# Patient Record
Sex: Female | Born: 1995 | Race: White | Hispanic: No | Marital: Single | Smoking: Never smoker
Health system: Southern US, Community
[De-identification: ages and names within clinical notes are randomized; demographics above are authoritative.]

## PROBLEM LIST (undated history)

## (undated) DIAGNOSIS — J45909 Unspecified asthma, uncomplicated: Secondary | ICD-10-CM

## (undated) DIAGNOSIS — M419 Scoliosis, unspecified: Secondary | ICD-10-CM

## (undated) HISTORY — PX: WRIST SURGERY: SHX841

## (undated) HISTORY — PX: BACK SURGERY: SHX140

---

## 2016-03-27 DIAGNOSIS — Z793 Long term (current) use of hormonal contraceptives: Secondary | ICD-10-CM | POA: Diagnosis not present

## 2016-03-27 DIAGNOSIS — J45909 Unspecified asthma, uncomplicated: Secondary | ICD-10-CM | POA: Diagnosis not present

## 2016-03-27 DIAGNOSIS — J329 Chronic sinusitis, unspecified: Secondary | ICD-10-CM | POA: Diagnosis not present

## 2016-03-27 DIAGNOSIS — R05 Cough: Secondary | ICD-10-CM | POA: Diagnosis not present

## 2016-07-31 DIAGNOSIS — Z1389 Encounter for screening for other disorder: Secondary | ICD-10-CM | POA: Diagnosis not present

## 2016-07-31 DIAGNOSIS — J029 Acute pharyngitis, unspecified: Secondary | ICD-10-CM | POA: Diagnosis not present

## 2016-08-06 DIAGNOSIS — R05 Cough: Secondary | ICD-10-CM | POA: Diagnosis not present

## 2016-08-06 DIAGNOSIS — J029 Acute pharyngitis, unspecified: Secondary | ICD-10-CM | POA: Diagnosis not present

## 2016-08-25 DIAGNOSIS — N760 Acute vaginitis: Secondary | ICD-10-CM | POA: Diagnosis not present

## 2016-10-08 DIAGNOSIS — N921 Excessive and frequent menstruation with irregular cycle: Secondary | ICD-10-CM | POA: Diagnosis not present

## 2016-11-05 DIAGNOSIS — S61552A Open bite of left wrist, initial encounter: Secondary | ICD-10-CM | POA: Diagnosis not present

## 2016-11-05 DIAGNOSIS — W5503XA Scratched by cat, initial encounter: Secondary | ICD-10-CM | POA: Diagnosis not present

## 2016-11-05 DIAGNOSIS — M25532 Pain in left wrist: Secondary | ICD-10-CM | POA: Diagnosis not present

## 2016-11-05 DIAGNOSIS — W5501XA Bitten by cat, initial encounter: Secondary | ICD-10-CM | POA: Diagnosis not present

## 2016-12-26 DIAGNOSIS — R0989 Other specified symptoms and signs involving the circulatory and respiratory systems: Secondary | ICD-10-CM | POA: Diagnosis not present

## 2016-12-26 DIAGNOSIS — J069 Acute upper respiratory infection, unspecified: Secondary | ICD-10-CM | POA: Diagnosis not present

## 2016-12-26 DIAGNOSIS — Z87440 Personal history of urinary (tract) infections: Secondary | ICD-10-CM | POA: Diagnosis not present

## 2016-12-26 DIAGNOSIS — R05 Cough: Secondary | ICD-10-CM | POA: Diagnosis not present

## 2016-12-26 DIAGNOSIS — Z8701 Personal history of pneumonia (recurrent): Secondary | ICD-10-CM | POA: Diagnosis not present

## 2016-12-26 DIAGNOSIS — Z793 Long term (current) use of hormonal contraceptives: Secondary | ICD-10-CM | POA: Diagnosis not present

## 2017-01-20 DIAGNOSIS — N921 Excessive and frequent menstruation with irregular cycle: Secondary | ICD-10-CM | POA: Diagnosis not present

## 2017-04-23 ENCOUNTER — Telehealth: Payer: Self-pay

## 2017-04-23 NOTE — Telephone Encounter (Signed)
Pt calling for a different rx of bcp c higher dose as she is still having BTB. [phone]

## 2017-04-24 NOTE — Telephone Encounter (Signed)
Let her know we will get back to her on Monday about this, as I need to review or old chart first.  When is she due to start a new pack of pills? Thx.

## 2017-04-24 NOTE — Telephone Encounter (Signed)
Pt states she has been off her pills for 2 weeks, aware you will call her monday

## 2017-04-27 MED ORDER — NORETHINDRON-ETHINYL ESTRAD-FE 1-20/1-30/1-35 MG-MCG PO TABS: 1.0000 | ORAL_TABLET | Freq: Every day | ORAL | 11 refills | Status: DC

## 2017-04-27 NOTE — Telephone Encounter (Signed)
[name] Needs to ensure she is not pregnant first, but then can fill Rx and restart BCP (new brand prescribed, please fax as we dont have pharmacy in system set up yet).

## 2017-04-27 NOTE — Telephone Encounter (Signed)
Pt aware.

## 2017-06-10 DIAGNOSIS — S8011XA Contusion of right lower leg, initial encounter: Secondary | ICD-10-CM | POA: Diagnosis not present

## 2017-06-10 DIAGNOSIS — S80811A Abrasion, right lower leg, initial encounter: Secondary | ICD-10-CM | POA: Diagnosis not present

## 2017-06-27 DIAGNOSIS — R509 Fever, unspecified: Secondary | ICD-10-CM | POA: Diagnosis not present

## 2017-07-20 ENCOUNTER — Telehealth: Payer: Self-pay

## 2017-07-20 NOTE — Telephone Encounter (Signed)
Left msg for pt to call back to discuss reason for change.

## 2017-07-20 NOTE — Telephone Encounter (Signed)
Pt c/o spotting between periods. States medication worked the first month but the past 2 months has had breakthrough bleeding. Pt states she takes medication at the same time each day but wants to know if she needs something stronger. Pt aware [facility] out of the office today. She is on her last week of pills (placebo). Thank you.

## 2017-07-20 NOTE — Telephone Encounter (Signed)
[facility] Please call patient

## 2017-07-20 NOTE — Telephone Encounter (Signed)
Patient wants to switch her birth control that she is currently taking.  Patient was not home when she called so she could not remember the name of the medication she is taking.  Pharmacy [facility].

## 2017-07-21 ENCOUNTER — Other Ambulatory Visit: Payer: Self-pay | Admitting: Obstetrics & Gynecology

## 2017-07-21 MED ORDER — DROSPIRENONE-ETHINYL ESTRADIOL 3-0.02 MG PO TABS: 1.0000 | ORAL_TABLET | Freq: Every day | ORAL | 11 refills | Status: DC

## 2017-07-21 NOTE — Telephone Encounter (Signed)
This I second time we have changed pills.  I have eRx Yaz pill, it is somewhat different and we can see.  Also she can consider other options such as patch, ring, IUD.

## 2017-07-21 NOTE — Telephone Encounter (Signed)
Left msg for patient with this information.

## 2017-09-20 ENCOUNTER — Emergency Department
Admission: EM | Admit: 2017-09-20 | Discharge: 2017-09-20 | Disposition: A | Discharge status: Home / Self Care | Payer: BLUE CROSS/BLUE SHIELD | Attending: Emergency Medicine | Admitting: Emergency Medicine

## 2017-09-20 ENCOUNTER — Emergency Department: Payer: BLUE CROSS/BLUE SHIELD

## 2017-09-20 ENCOUNTER — Encounter: Payer: Self-pay | Admitting: Emergency Medicine

## 2017-09-20 DIAGNOSIS — J029 Acute pharyngitis, unspecified: Secondary | ICD-10-CM | POA: Diagnosis not present

## 2017-09-20 DIAGNOSIS — Z79899 Other long term (current) drug therapy: Secondary | ICD-10-CM | POA: Diagnosis not present

## 2017-09-20 DIAGNOSIS — J45909 Unspecified asthma, uncomplicated: Secondary | ICD-10-CM | POA: Insufficient documentation

## 2017-09-20 DIAGNOSIS — R05 Cough: Secondary | ICD-10-CM | POA: Diagnosis not present

## 2017-09-20 DIAGNOSIS — R9431 Abnormal electrocardiogram [ECG] [EKG]: Secondary | ICD-10-CM | POA: Diagnosis not present

## 2017-09-20 DIAGNOSIS — B349 Viral infection, unspecified: Secondary | ICD-10-CM

## 2017-09-20 HISTORY — DX: Scoliosis, unspecified: M41.9

## 2017-09-20 HISTORY — DX: Unspecified asthma, uncomplicated: J45.909

## 2017-09-20 LAB — CBC
HCT: 39.8 % (ref 35.0–47.0)
Hemoglobin: 13.4 g/dL (ref 12.0–16.0)
MCH: 30.9 pg (ref 26.0–34.0)
MCHC: 33.7 g/dL (ref 32.0–36.0)
MCV: 91.6 fL (ref 80.0–100.0)
PLATELETS: 163 10*3/uL (ref 150–440)
RBC: 4.34 MIL/uL (ref 3.80–5.20)
RDW: 14.3 % (ref 11.5–14.5)
WBC: 8.9 10*3/uL (ref 3.6–11.0)

## 2017-09-20 LAB — BASIC METABOLIC PANEL
ANION GAP: 8 (ref 5–15)
BUN: 10 mg/dL (ref 6–20)
CALCIUM: 9.1 mg/dL (ref 8.9–10.3)
CO2: 24 mmol/L (ref 22–32)
CREATININE: 0.67 mg/dL (ref 0.44–1.00)
Chloride: 108 mmol/L (ref 101–111)
Glucose, Bld: 101 mg/dL — ABNORMAL HIGH (ref 65–99)
Potassium: 3.5 mmol/L (ref 3.5–5.1)
SODIUM: 140 mmol/L (ref 135–145)

## 2017-09-20 LAB — TROPONIN I: Troponin I: <0.03 ng/mL (ref <0.03)

## 2017-09-20 LAB — POCT RAPID STREP A: STREPTOCOCCUS, GROUP A SCREEN (DIRECT): NEGATIVE

## 2017-09-20 MED ORDER — BENZONATATE 100 MG PO CAPS: 100.0000 mg | ORAL_CAPSULE | Freq: Three times a day (TID) | ORAL | 0 refills | Status: DC | PRN

## 2017-09-20 MED ORDER — DEXAMETHASONE 4 MG PO TABS
10.0000 mg | ORAL_TABLET | Freq: Once | ORAL | Status: AC
  Administered 2017-09-20: 10 mg via ORAL
  Filled 2017-09-20: qty 2.5

## 2017-09-20 MED ORDER — ALBUTEROL SULFATE HFA 108 (90 BASE) MCG/ACT IN AERS: INHALATION_SPRAY | RESPIRATORY_TRACT | 1 refills | Status: DC

## 2017-09-20 NOTE — ED Provider Notes (Signed)
[facility] Emergency Department Provider Note  ____________________________________________   First MD Initiated Contact with Patient 09/20/17 [phone]     (approximate)  I have reviewed the triage vital signs and the nursing notes.   HISTORY  Chief Complaint Cough; Nasal Congestion; Sore Throat; and Chest Pain    HPI Jessica Everett is a 21 y.o. female with no chronic medical issues who presents for evaluation of about a week of upper respiratory symptoms and cough.  These include his congestion, runny nose, sore throat, and frequent nonproductive cough.  She feels some general malaise all over but denies fever/chills, chest pain, shortness of breath (other than cough), abdominal pain, nausea/vomiting, dysuria, diarrhea, and constipation.  She describes the symptoms as severe.  Nothing in particular makes the patient's symptoms better nor worse.  She states that she lost her voice a couple of days ago and that has come back but her throat still hurts.  She is not having any difficulty swallowing or speaking but it does hurt when she swallows.She has received all of her immunizations.   Past Medical History:  Diagnosis Date  . Asthma   . Scoliosis     There are no active problems to display for this patient.   Past Surgical History:  Procedure Laterality Date  . BACK SURGERY    . WRIST SURGERY      Prior to Admission medications   Medication Sig Start Date End Date Taking? Authorizing Provider  drospirenone-ethinyl estradiol (YAZ) 3-0.02 MG tablet Take 1 tablet by mouth daily. 07/21/17  Yes Nadara Mustard, MD  albuterol (PROVENTIL HFA;VENTOLIN HFA) 108 (90 Base) MCG/ACT inhaler Inhale 2-4 puffs by mouth every 4 hours as needed for wheezing, cough, and/or shortness of breath 09/20/17   Loleta Rose, MD  benzonatate (TESSALON PERLES) 100 MG capsule Take 1 capsule (100 mg total) by mouth 3 (three) times daily as needed for cough. 09/20/17   Loleta Rose, MD     Allergies Penicillins  No family history on file.  Social History Social History  Substance Use Topics  . Smoking status: Never Smoker  . Smokeless tobacco: Never Used  . Alcohol use No    Review of Systems Constitutional: No fever/chills Eyes: No visual changes. ENT: +sore throat. Cardiovascular: Denies chest pain. Respiratory: Denies shortness of breath. Frequent cough Gastrointestinal: No abdominal pain.  No nausea, no vomiting.  No diarrhea.  No constipation. Genitourinary: Negative for dysuria. Musculoskeletal: Negative for neck pain.  Negative for back pain. Integumentary: Negative for rash. Neurological: Negative for headaches, focal weakness or numbness.   ____________________________________________   PHYSICAL EXAM:  VITAL SIGNS: ED Triage Vitals  Enc Vitals Group     BP 09/20/17 0006 (!) 140/98     Pulse Rate 09/20/17 0006 69     Resp 09/20/17 0006 18     Temp 09/20/17 0006 98 F (36.7 C)     Temp Source 09/20/17 0006 Oral     SpO2 09/20/17 0006 100 %     Weight 09/20/17 0007 49.9 kg (110 lb)     Height 09/20/17 0007 1.626 m ( )     Head Circumference --      Peak Flow --      Pain Score 09/20/17 0006 7     Pain Loc --      Pain Edu? --      Excl. in GC? --     Constitutional: Alert and oriented. Well appearing and in no acute distress. Eyes:  Conjunctivae are normal.  Head: Atraumatic. Nose: No congestion/rhinnorhea. Mouth/Throat: Mucous membranes are moist.  Oropharynx non-erythematous.  No petechiae or exudate on the palate.  No evidence of abscess nor swelling. Neck: No stridor.  No meningeal signs.  No lymphadenopathy. Cardiovascular: Normal rate, regular rhythm. Good peripheral circulation. Grossly normal heart sounds. Respiratory: Normal respiratory effort.  No retractions. Lungs CTAB.  Frequent nonproductive cough. Gastrointestinal: Soft and nontender. No distention.  Musculoskeletal: No lower extremity tenderness nor edema. No  gross deformities of extremities. Neurologic:  Normal speech and language. No gross focal neurologic deficits are appreciated.  Skin:  Skin is warm, dry and intact. No rash noted. Psychiatric: Mood and affect are normal. Speech and behavior are normal.  ____________________________________________   LABS (all labs ordered are listed, but only abnormal results are displayed)  Labs Reviewed  BASIC METABOLIC PANEL - Abnormal; Notable for the following:       Result Value   Glucose, Bld 101 (*)    All other components within normal limits  CBC  TROPONIN I  POCT RAPID STREP A   ____________________________________________  EKG  ED ECG REPORT I, Huntington Leverich, the attending physician, personally viewed and interpreted this ECG.  Date: 09/20/2017 EKG Time: 00:09 Rate: 67 Rhythm: normal sinus rhythm QRS Axis: Right axis deviation Intervals: normal ST/T Wave abnormalities: normal Narrative Interpretation: no evidence of acute ischemia  ____________________________________________  RADIOLOGY   Dg Chest 2 View  Result Date: 09/20/2017 CLINICAL DATA:  Cough, congestion, and sore throat for 1 week. Chest pain developed on Thursday. EXAM: CHEST  2 VIEW COMPARISON:  None. FINDINGS: Mild hyperinflation. Normal heart size and pulmonary vascularity. No focal airspace disease or consolidation in the lungs. No blunting of costophrenic angles. No pneumothorax. Mediastinal contours appear intact. Postoperative rod and screw fixation of the thoracolumbar spine. IMPRESSION: No active cardiopulmonary disease. Electronically Signed   By: Burman Nieves M.D.   On: 09/20/2017 00:43    ____________________________________________   PROCEDURES  Critical Care performed: No   Procedure(s) performed:   Procedures   ____________________________________________   INITIAL IMPRESSION / ASSESSMENT AND PLAN / ED COURSE  As part of my medical decision making, I reviewed the following data  within the electronic MEDICAL RECORD NUMBER History obtained from family, Labs reviewed , EKG interpreted  and Radiograph reviewed     The patient is well-appearing in spite of her chief complaint and has normal vital signs.  Her workup was reassuring with no acute abnormalities on any of her lab work, chest x-ray, nor EKG.  Differential diagnosis includes community-acquired pneumonia, influenza, nonspecific viral infection, bacterial pharyngitis, epiglottitis, pressure pharyngeal abscess, etc.  However, based on the patient's reassuring physical exam, vital signs, and workup, I believe she is suffering from a nonspecific viral syndrome.  Her biggest concern and complaint is her sore throat, and after discussion of the risks and benefits, I agreed to give her a one-time dose of Decadron to help with her sore throat.  Additionally I will prescribe an albuterol inhaler to help with the bronchospasm and frequent cough.  She has a primary care doctor with whom she can follow-up (Dr. Lanier Ensign).    I gave my usual and customary return precautions.        ____________________________________________  FINAL CLINICAL IMPRESSION(S) / ED DIAGNOSES  Final diagnoses:  Acute viral syndrome  Pharyngitis, unspecified etiology     MEDICATIONS GIVEN DURING THIS VISIT:  Medications  dexamethasone (DECADRON) tablet 10 mg (10 mg Oral Given 09/20/17 0458)  NEW OUTPATIENT MEDICATIONS STARTED DURING THIS VISIT:  New Prescriptions   ALBUTEROL (PROVENTIL HFA;VENTOLIN HFA) 108 (90 BASE) MCG/ACT INHALER    Inhale 2-4 puffs by mouth every 4 hours as needed for wheezing, cough, and/or shortness of breath   BENZONATATE (TESSALON PERLES) 100 MG CAPSULE    Take 1 capsule (100 mg total) by mouth 3 (three) times daily as needed for cough.    Modified Medications   No medications on file    Discontinued Medications   No medications on file     Note:  This document was prepared using Dragon voice recognition  software and may include unintentional dictation errors.    Loleta Rose, MD 09/20/17 [phone]

## 2017-09-20 NOTE — ED Notes (Signed)
Pt. States cough with sore throat for the past week.  Pt.  States taking mussinex for congestion which has helped with congestion.

## 2017-09-20 NOTE — ED Triage Notes (Signed)
Patient with complaint of cough, congestion and sore throat times one week. Patient states that she developed chest pain Thursday. Patient denies any shortness of breath.

## 2017-09-20 NOTE — ED Notes (Signed)
Pt. Going home with mother. 

## 2017-09-20 NOTE — Discharge Instructions (Signed)

## 2017-09-24 DIAGNOSIS — R05 Cough: Secondary | ICD-10-CM | POA: Diagnosis not present

## 2017-09-24 DIAGNOSIS — J029 Acute pharyngitis, unspecified: Secondary | ICD-10-CM | POA: Diagnosis not present

## 2017-10-14 ENCOUNTER — Encounter: Payer: Self-pay | Admitting: Family Medicine

## 2017-10-14 ENCOUNTER — Ambulatory Visit (INDEPENDENT_AMBULATORY_CARE_PROVIDER_SITE_OTHER): Payer: BLUE CROSS/BLUE SHIELD | Admitting: Family Medicine

## 2017-10-14 VITALS — BP 123/79 | HR 75 | Temp 98.6°F | Ht 64.0 in | Wt 106.0 lb

## 2017-10-14 DIAGNOSIS — Z7689 Persons encountering health services in other specified circumstances: Secondary | ICD-10-CM | POA: Diagnosis not present

## 2017-10-14 DIAGNOSIS — J069 Acute upper respiratory infection, unspecified: Secondary | ICD-10-CM | POA: Diagnosis not present

## 2017-10-14 DIAGNOSIS — J4520 Mild intermittent asthma, uncomplicated: Secondary | ICD-10-CM | POA: Diagnosis not present

## 2017-10-14 DIAGNOSIS — J45909 Unspecified asthma, uncomplicated: Secondary | ICD-10-CM | POA: Insufficient documentation

## 2017-10-14 MED ORDER — AZITHROMYCIN 250 MG PO TABS
ORAL_TABLET | ORAL | 0 refills | Status: DC
Start: 2017-10-14 — End: 2017-10-21

## 2017-10-14 MED ORDER — PREDNISONE 10 MG PO TABS: ORAL_TABLET | ORAL | 0 refills | Status: DC

## 2017-10-14 NOTE — Progress Notes (Signed)
BP 123/79   Pulse 75   Temp 98.6 F (37 C)   Ht 5\' 4"  (1.626 m)   Wt 106 lb (48.1 kg)   LMP 10/05/2017 (Approximate)   SpO2 100%   BMI 18.19 kg/m    Subjective:    Patient ID: Jessica Everett, female    DOB: 06/28/1996, 21 y.o.   MRN: [id number]  HPI: Jessica Everett is a 21 y.o. female  Chief Complaint  Patient presents with  . Establish Care  . URI    occasionally productive cough x 6 weeks. Was seen at ED and meds that were given are not helping. No fever. No chest/head congestion,sore throat has resolved.   Patient presents today to establish care. No known medical conditions other than hx of childhood asthma. Has an albuterol inhaler but almost never needs to use it.   Recent ER visit for about 6 weeks of congestion, wheezing, productive cough that causes her to vomit. Denies fevers, chills, body aches. Several sick contacts. Inhaler and tessalon didn't help. Still taking singulair daily.   Taking birth control regularly, started yaz about 3 months ago and doing ok with it.   Pt has not had a CPE for several years. States she refuses to have labs drawn.   Past Medical History:  Diagnosis Date  . Asthma    as a child  . Scoliosis    Social History   Social History  . Marital status: Single    Spouse name: N/A  . Number of children: N/A  . Years of education: N/A   Occupational History  . Not on file.   Social History Main Topics  . Smoking status: Never Smoker  . Smokeless tobacco: Never Used  . Alcohol use No  . Drug use: No  . Sexual activity: Not on file   Other Topics Concern  . Not on file   Social History Narrative  . No narrative on file    Relevant past medical, surgical, family and social history reviewed and updated as indicated. Interim medical history since our last visit reviewed. Allergies and medications reviewed and updated.  Review of Systems  Constitutional: Negative.   HENT: Positive for congestion.   Respiratory: Positive for  cough and wheezing.   Cardiovascular: Negative.   Gastrointestinal: Negative.   Genitourinary: Negative.   Musculoskeletal: Negative.   Neurological: Negative.   Psychiatric/Behavioral: Negative.    Per HPI unless specifically indicated above     Objective:    BP 123/79   Pulse 75   Temp 98.6 F (37 C)   Ht 5\' 4"  (1.626 m)   Wt 106 lb (48.1 kg)   LMP 10/05/2017 (Approximate)   SpO2 100%   BMI 18.19 kg/m   Wt Readings from Last 3 Encounters:  10/14/17 106 lb (48.1 kg)  09/20/17 110 lb (49.9 kg)    Physical Exam  Constitutional: She is oriented to person, place, and time. She appears well-developed and well-nourished. No distress.  HENT:  Head: Atraumatic.  Right Ear: External ear normal.  Left Ear: External ear normal.  Nose: Nose normal.  Mouth/Throat: No oropharyngeal exudate.  Eyes: Pupils are equal, round, and reactive to light. Conjunctivae are normal. No scleral icterus.  Neck: Normal range of motion. Neck supple.  Cardiovascular: Normal rate and normal heart sounds.   Pulmonary/Chest: No respiratory distress. She has wheezes.  Musculoskeletal: Normal range of motion.  Neurological: She is alert and oriented to person, place, and time.  Skin: Skin is warm  and dry.  Psychiatric: She has a normal mood and affect. Her behavior is normal.  Nursing note and vitals reviewed.     Assessment & Plan:   Problem List Items Addressed This Visit      Respiratory   Asthma    Mild exacerbation due to URI. Continue albuterol inhaler prn.       Relevant Medications   montelukast (SINGULAIR) 10 MG tablet   predniSONE (DELTASONE) 10 MG tablet    Other Visit Diagnoses    Encounter to establish care    -  Primary   Upper respiratory tract infection, unspecified type       Will treat with zpack, prednisone, and prn albuterol. Delsym, mucinex, supportive care reviewed   Relevant Medications   azithromycin (ZITHROMAX) 250 MG tablet       Follow up plan: Return if  symptoms worsen or fail to improve.

## 2017-10-16 NOTE — Assessment & Plan Note (Signed)
Mild exacerbation due to URI. Continue albuterol inhaler prn.

## 2017-10-16 NOTE — Patient Instructions (Signed)
Follow up as needed

## 2017-10-20 ENCOUNTER — Ambulatory Visit: Payer: Self-pay | Admitting: *Deleted

## 2017-10-20 NOTE — Telephone Encounter (Signed)
Patient has finished antibiotic and steroid and she is still coughing. Patient is at work today- mother is calling.  Reason for Disposition . Cough has been present for > 3 weeks  Answer Assessment - Initial Assessment Questions 1. ONSET: "When did the cough begin?"      On 5th week- Urgent care didn't treat, 1 week ED gave cough pearls, 1 week PCP didn't treat. 4th week was seen at Dr Sallee LangeSoles- was treated Z-pak Singular, mucinex  2. SEVERITY: "How bad is the cough today?"      Feels cough sounds better- but is still present 3. RESPIRATORY DISTRESS: "Describe your breathing."      no 4. FEVER: "Do you have a fever?" If so, ask: "What is your temperature, how was it measured, and when did it start?"     no 5. SPUTUM: "Describe the color of your sputum" (clear, white, yellow, green)     No sputum 6. HEMOPTYSIS: "Are you coughing up any blood?" If so ask: "How much?" (flecks, streaks, tablespoons, etc.)     no 7. CARDIAC HISTORY: "Do you have any history of heart disease?" (e.g., heart attack, congestive heart failure)      no 8. LUNG HISTORY: "Do you have any history of lung disease?"  (e.g., pulmonary embolus, asthma, emphysema)     no 9. PE RISK FACTORS: "Do you have a history of blood clots?" (or: recent major surgery, recent prolonged travel, bedridden )     no 10. OTHER SYMPTOMS: "Do you have any other symptoms?" (e.g., runny nose, wheezing, chest pain)       no 11. PREGNANCY: "Is there any chance you are pregnant?" "When was your last menstrual period?"       Unknown- OCP 12. TRAVEL: "Have you traveled out of the country in the last month?" (e.g., travel history, exposures)       no  Protocols used: COUGH - ACUTE PRODUCTIVE-A-AH

## 2017-10-21 ENCOUNTER — Ambulatory Visit: Payer: BLUE CROSS/BLUE SHIELD | Admitting: Family Medicine

## 2017-10-21 ENCOUNTER — Encounter: Payer: Self-pay | Admitting: Family Medicine

## 2017-10-21 VITALS — BP 131/82 | HR 69 | Temp 98.9°F | Wt 109.0 lb

## 2017-10-21 DIAGNOSIS — J4521 Mild intermittent asthma with (acute) exacerbation: Secondary | ICD-10-CM

## 2017-10-21 MED ORDER — FLUTICASONE PROPIONATE HFA 110 MCG/ACT IN AERO: 1.0000 | INHALATION_SPRAY | Freq: Two times a day (BID) | RESPIRATORY_TRACT | 1 refills | Status: DC | PRN

## 2017-10-21 NOTE — Progress Notes (Signed)
   BP 131/82   Pulse 69   Temp 98.9 F (37.2 C) (Oral)   Wt 109 lb (49.4 kg)   LMP 10/05/2017 (Approximate)   SpO2 99%   BMI 18.71 kg/m    Subjective:    Patient ID: Jessica Everett, female    DOB: 07-17-1996, 21 y.o.   MRN: [id number]  HPI: Jessica Everett is a 21 y.o. female  Chief Complaint  Patient presents with  . Cough    Completed antibiotic and prednisone.    Patient presents today for URI f/u. Completed abx and prednisone, improved while on medications but still having persistent cough since being off prednisone. Taking mucinex DM with mild relief.  Refuses syrups, capsules, and lozenges as well as inhalers containing albuterol. Denies fever, chills, sweats, aches, CP, SOB.   Relevant past medical, surgical, family and social history reviewed and updated as indicated. Interim medical history since our last visit reviewed. Allergies and medications reviewed and updated.  Review of Systems  Constitutional: Negative.   HENT: Negative.   Eyes: Negative.   Respiratory: Positive for cough and chest tightness.   Cardiovascular: Negative.   Musculoskeletal: Negative.   Neurological: Negative.   Psychiatric/Behavioral: Negative.     Per HPI unless specifically indicated above     Objective:    BP 131/82   Pulse 69   Temp 98.9 F (37.2 C) (Oral)   Wt 109 lb (49.4 kg)   LMP 10/05/2017 (Approximate)   SpO2 99%   BMI 18.71 kg/m   Wt Readings from Last 3 Encounters:  10/21/17 109 lb (49.4 kg)  10/14/17 106 lb (48.1 kg)  09/20/17 110 lb (49.9 kg)    Physical Exam  Constitutional: She is oriented to person, place, and time. She appears well-developed and well-nourished. No distress.  HENT:  Head: Atraumatic.  Eyes: Conjunctivae are normal. No scleral icterus.  Neck: Normal range of motion. Neck supple.  Cardiovascular: Normal rate and normal heart sounds.  Pulmonary/Chest: Effort normal and breath sounds normal. No respiratory distress.  Musculoskeletal: Normal range  of motion.  Lymphadenopathy:    She has no cervical adenopathy.  Neurological: She is alert and oriented to person, place, and time.  Skin: Skin is warm and dry.  Psychiatric: She has a normal mood and affect. Her behavior is normal.  Nursing note and vitals reviewed.     Assessment & Plan:   Problem List Items Addressed This Visit      Respiratory   Asthma - Primary    Long discussion with patient about options as she has many limitations as far as what she is open to taking. After assuring her that they make steroid inhalers without albuterol, pt agreeable to trying flovent BID for several weeks and seeing if sxs improve. Discussed proper use and rinsing mouth after each time.       Relevant Medications   fluticasone (FLOVENT HFA) 110 MCG/ACT inhaler       Follow up plan: Return if symptoms worsen or fail to improve.

## 2017-10-23 NOTE — Assessment & Plan Note (Signed)
Long discussion with patient about options as she has many limitations as far as what she is open to taking. After assuring her that they make steroid inhalers without albuterol, pt agreeable to trying flovent BID for several weeks and seeing if sxs improve. Discussed proper use and rinsing mouth after each time.

## 2017-10-23 NOTE — Patient Instructions (Signed)
Follow up as needed

## 2017-12-02 ENCOUNTER — Telehealth: Payer: Self-pay | Admitting: Family Medicine

## 2017-12-02 NOTE — Telephone Encounter (Signed)
Copied from CRM [phone]. Topic: Quick Communication - See Telephone Encounter >> Dec 02, 2017  8:21 AM Diana EvesHoyt, Maryann B wrote: CRM for notification. See Telephone encounter for:  Pt still having the same symptoms from her appt with Maurice MarchLane and she has tried allergy meds and using oils and nothing has helped. Can something be called in?  12/02/17.

## 2017-12-02 NOTE — Telephone Encounter (Signed)
Routing to provider  

## 2017-12-02 NOTE — Telephone Encounter (Signed)
Please Advise

## 2017-12-02 NOTE — Telephone Encounter (Signed)
Patient stated the only thing that helped was the Z-Pack, it went away and came back 2 weeks later. She's out of the Flovent inhaler. Explained to patient to get another round of an antibiotics she'd need to be seen. Patient stated that that can't happen because she doesn't have the money. I explained to patient that I'd route to Fleet ContrasRachel to advise. Patient does want a refill on her Flovent if possible. She stated that if the antibiotic can't be refilled without being seen, it will just have to wait.

## 2017-12-02 NOTE — Telephone Encounter (Signed)
Is she still taking the flovent inhaler? Given her restrictions on what she is willing to take, other than zyrtec and mucinex DM there isn't much else we can try for her cough. I haven't seen her in over a month so if these things aren't helping I will need to see her again

## 2017-12-03 MED ORDER — FLUTICASONE PROPIONATE HFA 110 MCG/ACT IN AERO: 1.0000 | INHALATION_SPRAY | Freq: Two times a day (BID) | RESPIRATORY_TRACT | 11 refills | Status: DC | PRN

## 2017-12-03 NOTE — Telephone Encounter (Signed)
I sent in a year's worth of flovent. She needs to be taking that regularly to help with her cough

## 2017-12-03 NOTE — Telephone Encounter (Signed)
Left message on machine for pt to return call to the office. CRM updated.  

## 2017-12-11 NOTE — Telephone Encounter (Signed)
Left message on machine for pt to return call to the office.  

## 2018-01-11 DIAGNOSIS — M9901 Segmental and somatic dysfunction of cervical region: Secondary | ICD-10-CM | POA: Diagnosis not present

## 2018-01-11 DIAGNOSIS — M62830 Muscle spasm of back: Secondary | ICD-10-CM | POA: Diagnosis not present

## 2018-01-11 DIAGNOSIS — M436 Torticollis: Secondary | ICD-10-CM | POA: Diagnosis not present

## 2018-01-11 DIAGNOSIS — M4602 Spinal enthesopathy, cervical region: Secondary | ICD-10-CM | POA: Diagnosis not present

## 2018-01-12 DIAGNOSIS — M4602 Spinal enthesopathy, cervical region: Secondary | ICD-10-CM | POA: Diagnosis not present

## 2018-01-12 DIAGNOSIS — M436 Torticollis: Secondary | ICD-10-CM | POA: Diagnosis not present

## 2018-01-12 DIAGNOSIS — M62830 Muscle spasm of back: Secondary | ICD-10-CM | POA: Diagnosis not present

## 2018-01-12 DIAGNOSIS — M9901 Segmental and somatic dysfunction of cervical region: Secondary | ICD-10-CM | POA: Diagnosis not present

## 2018-03-30 ENCOUNTER — Telehealth: Payer: Self-pay | Admitting: Family Medicine

## 2018-03-30 NOTE — Telephone Encounter (Signed)
Copied from CRM [phone]. Topic: Quick Communication - See Telephone Encounter >> Mar 30, 2018  1:02 PM Eston Mouldavis, Emerald Shor B wrote: CRM for notification. See Telephone encounter for: 03/30/18. 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

## 2018-03-31 NOTE — Telephone Encounter (Signed)
Routing to provider  

## 2018-04-03 IMAGING — CR DG CHEST 2V
1 series · 2 of 2 positions shown · non-contrast
Comparison: None.

CLINICAL DATA: Cough, congestion, and sore throat for 1 week. Chest
pain developed on [REDACTED].

EXAM:
CHEST  2 VIEW

[Series 1: dg chest 2 view · 0.14mm/px · 2 of 2 slices shown]
[im 1/2]
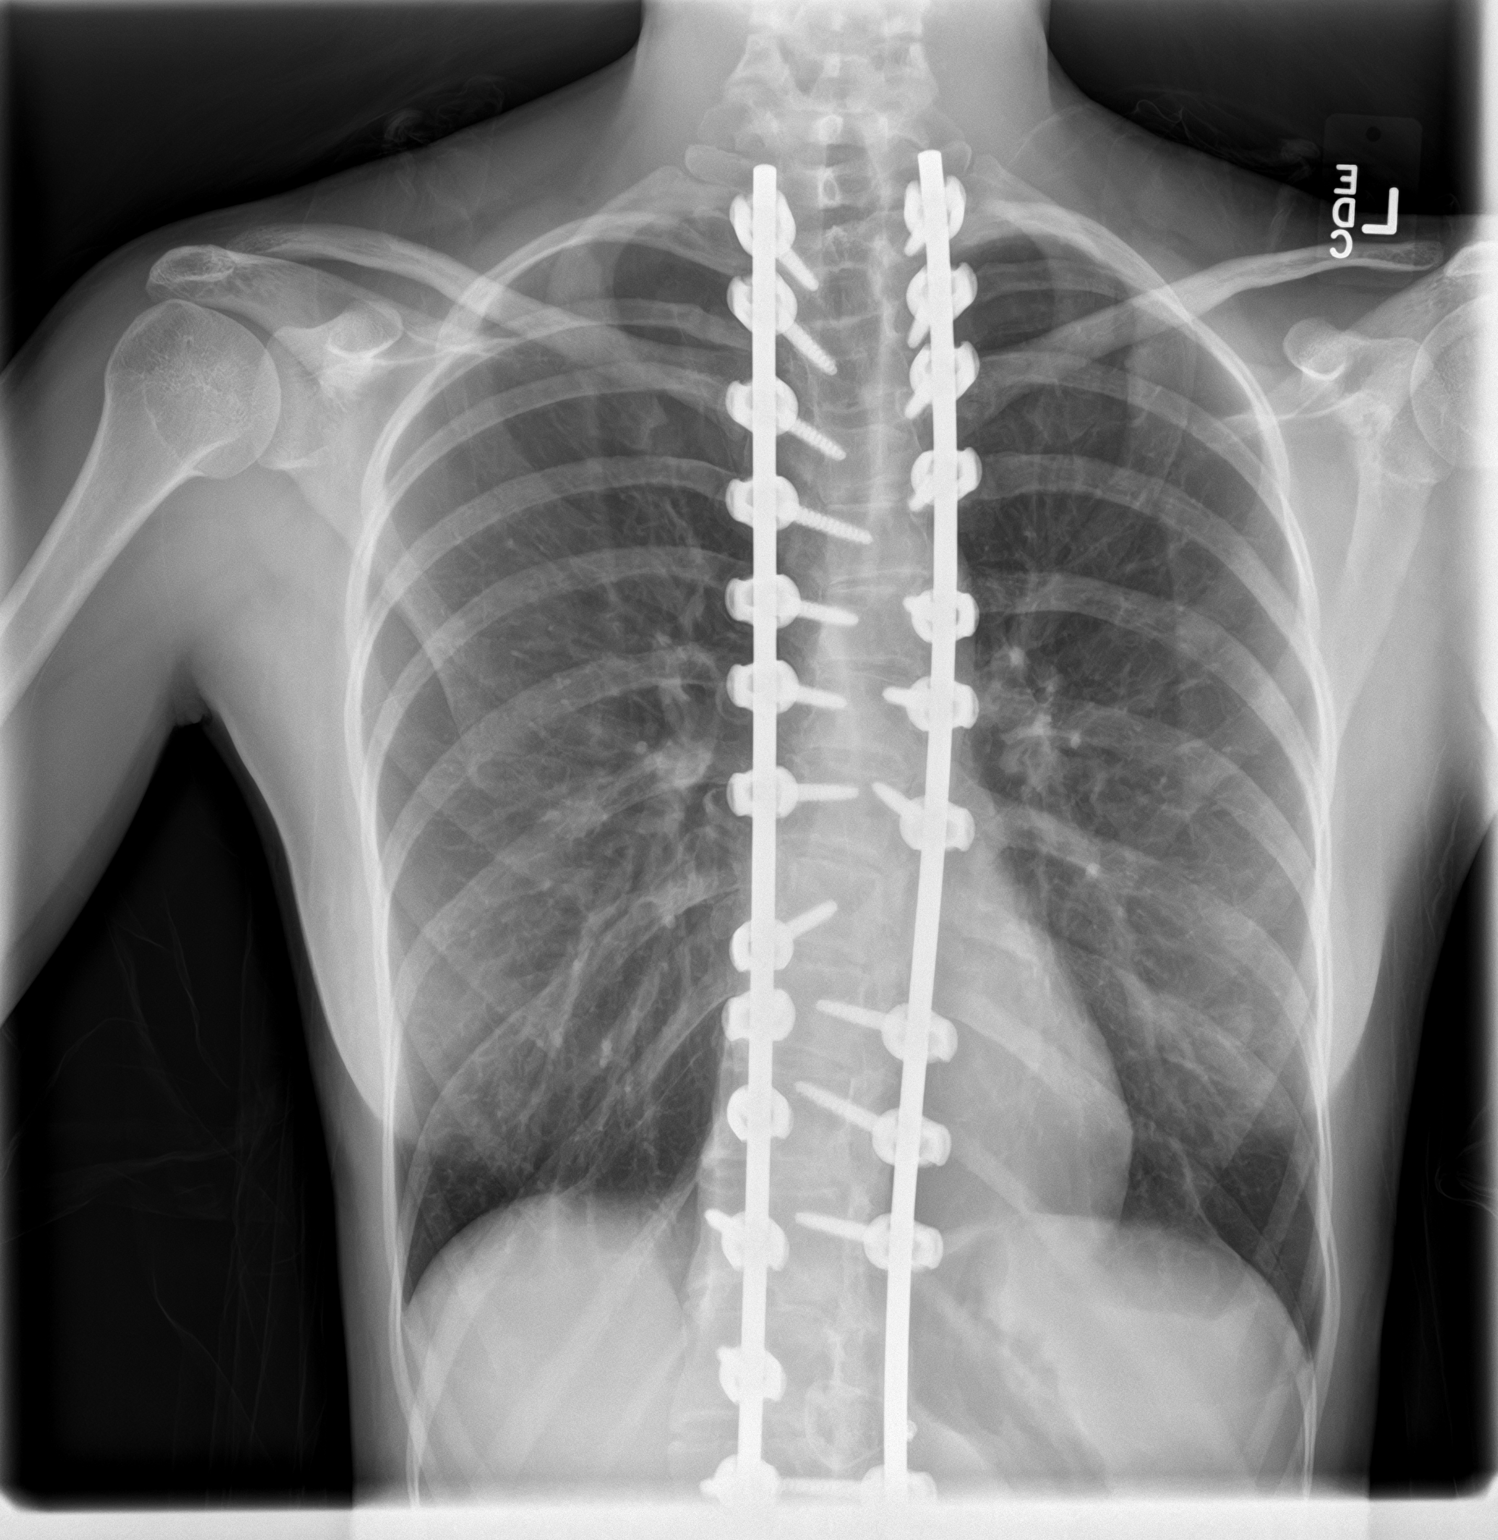
[im 2/2]
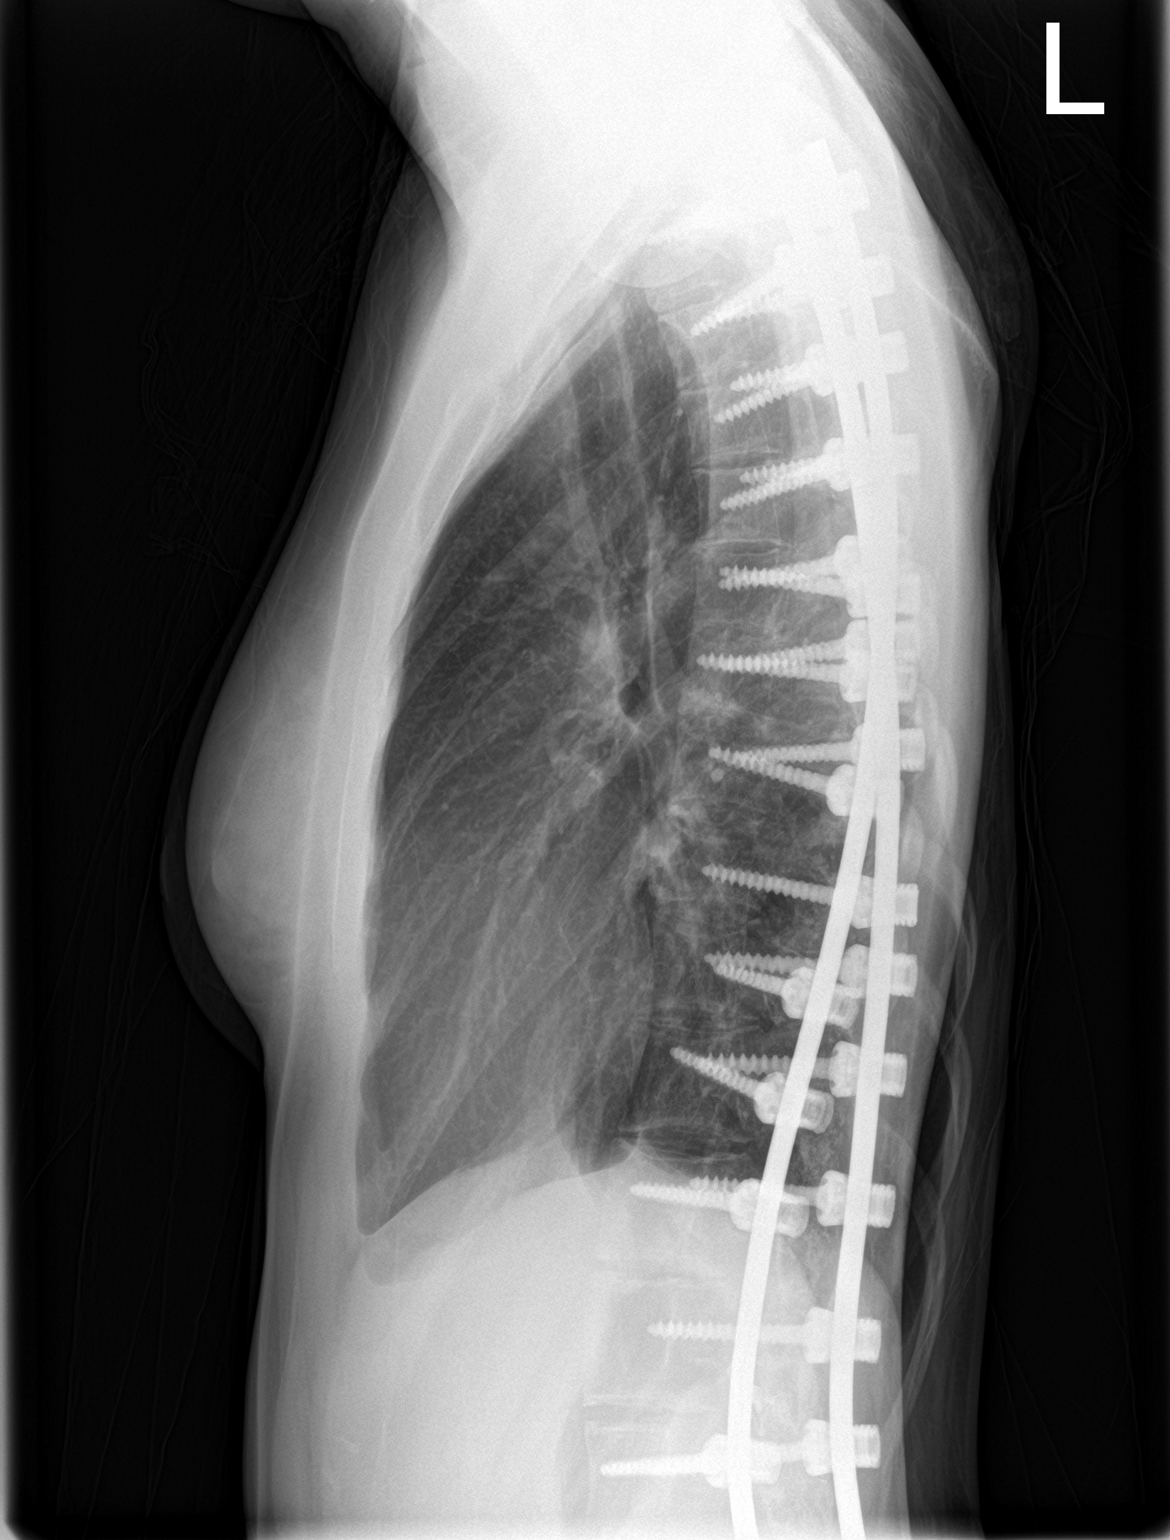

[2 of 2 positions shown; findings below may reference images not displayed]

FINDINGS: Mild hyperinflation. Normal heart size and pulmonary vascularity. No
focal airspace disease or consolidation in the lungs. No blunting of
costophrenic angles. No pneumothorax. Mediastinal contours appear
intact. Postoperative rod and screw fixation of the thoracolumbar
spine.
IMPRESSION: No active cardiopulmonary disease.

## 2018-04-06 ENCOUNTER — Ambulatory Visit: Payer: Self-pay | Admitting: *Deleted

## 2018-04-06 NOTE — Telephone Encounter (Addendum)
Pt called stating she has heat exhaustion. 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 She wears a tank top that has been wet down with water. She states she drinks 4 bottles of water in 2 hours. She does not feel dehydrated. She does not feel like she has a fever but can not check her temp right now. Per protocol, home care advice given to pt with verbal understanding. Pt also to avoid salty foods and carbonated and caffinated drinks. Advise shirts with long sleeves that will let her skin breath but keep the sun off. Pt voiced understanding and will call back if symptoms worsen.  Reason for Disposition . Normal hot, flushed (pink) skin from heat exposure  Answer Assessment - Initial Assessment Questions 1. SYMPTOMS: "What symptoms are you concerned about?"     Hard to breath 2. ONSET:  "When did the symptoms start?"     A month ago 3. FEVER: "Do you have a fever?" If so, ask: "What is it, how was it measured, and when did it start?"      no 4. HEAT EXPOSURE: "What caused you to become hot?" (e.g., outside in the sun, inside a hot factory)     Being outside in the sun for long periods of time 5. PHYSICAL ACTIVITY:  "What type of physical activity were you doing?" (e.g., working, sports)     Rides horses for competition 6. SICK: "Have you been sick recently?" (e.g., cold, flu, recent fever)     no 7. OTHER SYMPTOMS: "Do you have any other symptoms?" (e.g., fainting, flushed skin, weakness, nausea, vomiting, muscle cramps)     Faint, flushed skin 8. PREGNANCY: "Is there any chance you are pregnant?" "When was your last menstrual period?"     No, LMP today  Protocols used: HEAT EXPOSURE (HEAT EXHAUSTION AND HEAT STROKE)-A-AH

## 2018-04-06 NOTE — Telephone Encounter (Signed)
Just FYI.

## 2018-04-07 ENCOUNTER — Ambulatory Visit: Payer: BLUE CROSS/BLUE SHIELD | Admitting: Family Medicine

## 2018-05-06 ENCOUNTER — Other Ambulatory Visit: Payer: Self-pay | Admitting: Allergy

## 2018-05-06 ENCOUNTER — Ambulatory Visit
Admission: RE | Admit: 2018-05-06 | Discharge: 2018-05-06 | Disposition: A | Discharge status: Home / Self Care | Payer: BLUE CROSS/BLUE SHIELD | Source: Ambulatory Visit | Attending: Allergy | Admitting: Allergy

## 2018-05-06 DIAGNOSIS — R059 Cough, unspecified: Secondary | ICD-10-CM

## 2018-05-06 DIAGNOSIS — R05 Cough: Secondary | ICD-10-CM | POA: Insufficient documentation

## 2018-05-06 DIAGNOSIS — Z981 Arthrodesis status: Secondary | ICD-10-CM | POA: Insufficient documentation

## 2018-05-06 DIAGNOSIS — J3089 Other allergic rhinitis: Secondary | ICD-10-CM | POA: Diagnosis not present

## 2018-05-06 DIAGNOSIS — J309 Allergic rhinitis, unspecified: Secondary | ICD-10-CM | POA: Diagnosis not present

## 2018-05-06 DIAGNOSIS — R0602 Shortness of breath: Secondary | ICD-10-CM | POA: Diagnosis not present

## 2018-06-13 ENCOUNTER — Other Ambulatory Visit: Payer: Self-pay | Admitting: Obstetrics & Gynecology

## 2018-06-14 ENCOUNTER — Telehealth: Payer: Self-pay | Admitting: Obstetrics & Gynecology

## 2018-06-14 ENCOUNTER — Other Ambulatory Visit: Payer: Self-pay

## 2018-06-14 MED ORDER — DROSPIRENONE-ETHINYL ESTRADIOL 3-0.02 MG PO TABS: 1.0000 | ORAL_TABLET | Freq: Every day | ORAL | 1 refills | Status: DC

## 2018-06-14 NOTE — Telephone Encounter (Signed)
Called and left voice mail for patient to call back to be schedule °

## 2018-06-14 NOTE — Telephone Encounter (Signed)
Pt called stating she had scheduled appt for the 31st and needed refill on bc to get her to appt.  [phone]  Pt aware refill eRx'd.

## 2018-06-14 NOTE — Telephone Encounter (Signed)
-----   Message from Nadara Mustardobert P Harris, MD sent at 06/13/2018  6:09 PM EDT ----- Regarding: sch Annual Will refill rx this month Plz sch Annual soon

## 2018-06-29 ENCOUNTER — Encounter: Payer: Self-pay | Admitting: Obstetrics & Gynecology

## 2018-06-29 ENCOUNTER — Other Ambulatory Visit (HOSPITAL_COMMUNITY)
Admission: RE | Admit: 2018-06-29 | Discharge: 2018-06-29 | Disposition: A | Discharge status: Home / Self Care | Payer: BLUE CROSS/BLUE SHIELD | Source: Ambulatory Visit | Attending: Obstetrics & Gynecology | Admitting: Obstetrics & Gynecology

## 2018-06-29 ENCOUNTER — Ambulatory Visit (INDEPENDENT_AMBULATORY_CARE_PROVIDER_SITE_OTHER): Payer: BLUE CROSS/BLUE SHIELD | Admitting: Obstetrics & Gynecology

## 2018-06-29 VITALS — BP 118/70 | HR 78 | Ht 64.0 in | Wt 119.0 lb

## 2018-06-29 DIAGNOSIS — Z124 Encounter for screening for malignant neoplasm of cervix: Secondary | ICD-10-CM | POA: Diagnosis not present

## 2018-06-29 DIAGNOSIS — Z0000 Encounter for general adult medical examination without abnormal findings: Secondary | ICD-10-CM

## 2018-06-29 DIAGNOSIS — Z01419 Encounter for gynecological examination (general) (routine) without abnormal findings: Secondary | ICD-10-CM

## 2018-06-29 LAB — HM PAP SMEAR: HM Pap smear: NORMAL

## 2018-06-29 MED ORDER — DROSPIRENONE-ETHINYL ESTRADIOL 3-0.02 MG PO TABS: 1.0000 | ORAL_TABLET | Freq: Every day | ORAL | 3 refills | Status: DC

## 2018-06-29 NOTE — Progress Notes (Addendum)
HPI:      Ms. Jessica Everett is a 22 y.o. G0P0000 who LMP was Patient's last menstrual period was 06/25/2018 (exact date)., she presents today for her annual examination. The patient has no complaints today. The patient is sexually active. Her no prior history of gyn screening tests. The patient does perform self breast exams.  There is no notable family history of breast or ovarian cancer in her family.  The patient has regular exercise: yes.  The patient denies current symptoms of depression.    GYN History: Contraception: OCP (estrogen/progesterone).  Jessica FieldYaz has done better w periods. Gardasil done as teen  PMHx: Past Medical History:  Diagnosis Date  . Asthma    as a child  . Scoliosis    Past Surgical History:  Procedure Laterality Date  . BACK SURGERY    . WRIST SURGERY     Family History  Problem Relation Age of Onset  . Cancer Maternal Grandfather   . Heart disease Maternal Grandfather   . Stroke Maternal Grandfather    Social History   Tobacco Use  . Smoking status: Never Smoker  . Smokeless tobacco: Never Used  Substance Use Topics  . Alcohol use: No  . Drug use: No    Current Outpatient Medications:  .  drospirenone-ethinyl estradiol (YAZ,GIANVI,LORYNA) 3-0.02 MG tablet, Take 1 tablet by mouth daily., Disp: 28 tablet, Rfl: 1 .  albuterol (PROVENTIL HFA;VENTOLIN HFA) 108 (90 Base) MCG/ACT inhaler, Inhale 2-4 puffs by mouth every 4 hours as needed for wheezing, cough, and/or shortness of breath (Patient not taking: Reported on 06/29/2018), Disp: 1 Inhaler, Rfl: 1 .  fluticasone (FLOVENT HFA) 110 MCG/ACT inhaler, Inhale 1 puff into the lungs 2 (two) times daily as needed. (Patient not taking: Reported on 06/29/2018), Disp: 1 Inhaler, Rfl: 11 .  predniSONE (DELTASONE) 10 MG tablet, Take 6 tabs day one, 5 tabs day two, 4 tabs day three, etc (Patient not taking: Reported on 06/29/2018), Disp: 21 tablet, Rfl: 0 Allergies: Penicillins and Tessalon [benzonatate]  Review of  Systems  Constitutional: Negative for chills, fever and malaise/fatigue.  HENT: Negative for congestion, sinus pain and sore throat.   Eyes: Negative for blurred vision and pain.  Respiratory: Negative for cough and wheezing.   Cardiovascular: Negative for chest pain and leg swelling.  Gastrointestinal: Negative for abdominal pain, constipation, diarrhea, heartburn, nausea and vomiting.  Genitourinary: Negative for dysuria, frequency, hematuria and urgency.  Musculoskeletal: Negative for back pain, joint pain, myalgias and neck pain.  Skin: Negative for itching and rash.  Neurological: Negative for dizziness, tremors and weakness.  Endo/Heme/Allergies: Does not bruise/bleed easily.  Psychiatric/Behavioral: Negative for depression. The patient is not nervous/anxious and does not have insomnia.    Objective: BP 118/70   Pulse 78   Ht 5\' 4"  (1.626 m)   Wt 119 lb (54 kg)   LMP 06/25/2018 (Exact Date)   BMI 20.43 kg/m   Filed Weights   06/29/18 1419  Weight: 119 lb (54 kg)   Body mass index is 20.43 kg/m. Physical Exam  Constitutional: She is oriented to person, place, and time. She appears well-developed and well-nourished. No distress.  Genitourinary: Rectum normal, vagina normal and uterus normal. Pelvic exam was performed with patient supine. There is no rash or lesion on the right labia. There is no rash or lesion on the left labia. Vagina exhibits no lesion. No bleeding in the vagina. Right adnexum does not display mass and does not display tenderness. Left adnexum does not  display mass and does not display tenderness. Cervix does not exhibit motion tenderness, lesion, friability or polyp.   Uterus is mobile and midaxial. Uterus is not enlarged or exhibiting a mass.  HENT:  Head: Normocephalic and atraumatic. Head is without laceration.  Right Ear: Hearing normal.  Left Ear: Hearing normal.  Nose: No epistaxis.  No foreign bodies.  Mouth/Throat: Uvula is midline, oropharynx is  clear and moist and mucous membranes are normal.  Eyes: Pupils are equal, round, and reactive to light.  Neck: Normal range of motion. Neck supple. No thyromegaly present.  Cardiovascular: Normal rate and regular rhythm. Exam reveals no gallop and no friction rub.  No murmur heard. Pulmonary/Chest: Effort normal and breath sounds normal. No respiratory distress. She has no wheezes. Right breast exhibits no mass, no skin change and no tenderness. Left breast exhibits no mass, no skin change and no tenderness.  Abdominal: Soft. Bowel sounds are normal. She exhibits no distension. There is no tenderness. There is no rebound.  Musculoskeletal: Normal range of motion.  Neurological: She is alert and oriented to person, place, and time. No cranial nerve deficit.  Skin: Skin is warm and dry.  Psychiatric: She has a normal mood and affect. Judgment normal.  Vitals reviewed.  Assessment:  ANNUAL EXAM 1. Annual physical exam   2. Screening for cervical cancer    Screening Plan:            1.  Cervical Screening-  Pap smear done today, Pap smear schedule reviewed with patient, DNA probe for chlamydia and GC obtained  2. Labs managed by PCP  3. Counseling for contraception: oral contraceptives (estrogen/progesterone)    F/U  Return in about 1 year (around 06/30/2019) for Annual.  Annamarie Major, MD, [facility], [facility] 06/29/2018  2:25 PM

## 2018-06-29 NOTE — Patient Instructions (Signed)
Drospirenone; Ethinyl Estradiol tablets What is this medicine? DROSPIRENONE; ETHINYL ESTRADIOL (dro SPY re nown; ETH in il es tra DYE ole) is an oral contraceptive (birth control pill). This medicine combines two types of female hormones, an estrogen and a progestin. It is used to prevent ovulation and pregnancy. This medicine may be used for other purposes; ask your health care provider or pharmacist if you have questions. COMMON BRAND NAME(S): Gianvi, Loryna, Nikki 28-Day, Ocella, Syeda, Vestura, Yasmin, Yaz, Zarah What should I tell my health care provider before I take this medicine? They need to know if you have or ever had any of these conditions: -abnormal vaginal bleeding -adrenal gland disease -blood vessel disease or blood clots -breast, cervical, endometrial, ovarian, liver, or uterine cancer -diabetes -gallbladder disease -heart disease or recent heart attack -high blood pressure -high cholesterol -high potassium level -kidney disease -liver disease -migraine headaches -stroke -systemic lupus erythematosus (SLE) -tobacco smoker -an unusual or allergic reaction to estrogens, progestins, or other medicines, foods, dyes, or preservatives -pregnant or trying to get pregnant -breast-feeding How should I use this medicine? Take this medicine by mouth. To reduce nausea, this medicine may be taken with food. Follow the directions on the prescription label. Take this medicine at the same time each day and in the order directed on the package. Do not take your medicine more often than directed. A patient package insert for the product will be given with each prescription and refill. Read this sheet carefully each time. The sheet may change frequently. Talk to your pediatrician regarding the use of this medicine in children. Special care may be needed. This medicine has been used in female children who have started having menstrual periods. Overdosage: If you think you have taken too  much of this medicine contact a poison control center or emergency room at once. NOTE: This medicine is only for you. Do not share this medicine with others. What if I miss a dose? If you miss a dose, refer to the patient information sheet you received with your medicine for direction. If you miss more than one pill, this medicine may not be as effective and you may need to use another form of birth control. What may interact with this medicine? Do not take this medicine with any of the following medications: -aminoglutethimide -amprenavir, fosamprenavir -atazanavir; cobicistat -anastrozole -bosentan -exemestane -letrozole -metyrapone -testolactone This medicine may also interact with the following medications: -acetaminophen -antiviral medicines for HIV or AIDS -aprepitant -barbiturates -certain antibiotics like rifampin, rifabutin, rifapentine, and possibly penicillins or tetracyclines -certain diuretics like amiloride, spironolactone, triamterene -certain medicines for fungal infections like griseofulvin, ketoconazole, itraconazole -certain medications for high blood pressure or heart conditions like ACE-inhibitors, Angiotensin-II receptor blockers, eplerenone -certain medicines for seizures like carbamazepine, oxcarbazepine, phenobarbital, phenytoin -cholestyramine -cobicistat -corticosteroid like hydrocortisone and prednisolone -cyclosporine -dantrolene -felbamate -grapefruit juice -heparin -lamotrigine -medicines for diabetes, including pioglitazone -modafinil -NSAIDs -potassium supplements -pyrimethamine -raloxifene -St. John's wort -sulfasalazine -tamoxifen -topiramate -thyroid hormones -warfarin his list may not describe all possible interactions. Give your health care provider a list of all the medicines, herbs, non-prescription drugs, or dietary supplements you use. Also tell them if you smoke, drink alcohol, or use illegal drugs. Some items may interact with  your medicine. This list may not describe all possible interactions. Give your health care provider a list of all the medicines, herbs, non-prescription drugs, or dietary supplements you use. Also tell them if you smoke, drink alcohol, or use illegal drugs. Some items may interact with your   medicine. What should I watch for while using this medicine? Visit your doctor or health care professional for regular checks on your progress. You will need a regular breast and pelvic exam and Pap smear while on this medicine. Use an additional method of contraception during the first cycle that you take these tablets. If you have any reason to think you are pregnant, stop taking this medicine right away and contact your doctor or health care professional. If you are taking this medicine for hormone related problems, it may take several cycles of use to see improvement in your condition. Smoking increases the risk of getting a blood clot or having a stroke while you are taking birth control pills, especially if you are more than 21 years old. You are strongly advised not to smoke. This medicine can make your body retain fluid, making your fingers, hands, or ankles swell. Your blood pressure can go up. Contact your doctor or health care professional if you feel you are retaining fluid. This medicine can make you more sensitive to the sun. Keep out of the sun. If you cannot avoid being in the sun, wear protective clothing and use sunscreen. Do not use sun lamps or tanning beds/booths. If you wear contact lenses and notice visual changes, or if the lenses begin to feel uncomfortable, consult your eye care specialist. In some women, tenderness, swelling, or minor bleeding of the gums may occur. Notify your dentist if this happens. Brushing and flossing your teeth regularly may help limit this. See your dentist regularly and inform your dentist of the medicines you are taking. If you are going to have elective surgery,  you may need to stop taking this medicine before the surgery. Consult your health care professional for advice. This medicine does not protect you against HIV infection (AIDS) or any other sexually transmitted diseases. What side effects may I notice from receiving this medicine? Side effects that you should report to your doctor or health care professional as soon as possible: -allergic reactions like skin rash, itching or hives, swelling of the face, lips, or tongue -breast tissue changes or discharge -changes in vision -chest pain -confusion, trouble speaking or understanding -dark urine -general ill feeling or flu-like symptoms -light-colored stools -nausea, vomiting -pain, swelling, warmth in the leg -right upper belly pain -severe headaches -shortness of breath -sudden numbness or weakness of the face, arm or leg -trouble walking, dizziness, loss of balance or coordination -unusual vaginal bleeding -yellowing of the eyes or skin Side effects that usually do not require medical attention (report to your doctor or health care professional if they continue or are bothersome): -acne -brown spots on the face -change in appetite -change in sexual desire -depressed mood or mood swings -fluid retention and swelling -stomach cramps or bloating -unusually weak or tired -weight gain This list may not describe all possible side effects. Call your doctor for medical advice about side effects. You may report side effects to FDA at 1-800-FDA-1088. Where should I keep my medicine? Keep out of the reach of children. Store at room temperature between 15 and 30 degrees C (59 and 86 degrees F). Throw away any unused medicine after the expiration date. NOTE: This sheet is a summary. It may not cover all possible information. If you have questions about this medicine, talk to your doctor, pharmacist, or health care provider.  2018 Elsevier/Gold Standard (2016-08-22 13:52:56)  

## 2018-07-01 LAB — CYTOLOGY - PAP
CHLAMYDIA, DNA PROBE: NEGATIVE
Diagnosis: NEGATIVE
NEISSERIA GONORRHEA: NEGATIVE

## 2018-07-05 DIAGNOSIS — M9901 Segmental and somatic dysfunction of cervical region: Secondary | ICD-10-CM | POA: Diagnosis not present

## 2018-07-05 DIAGNOSIS — M546 Pain in thoracic spine: Secondary | ICD-10-CM | POA: Diagnosis not present

## 2018-07-05 DIAGNOSIS — M542 Cervicalgia: Secondary | ICD-10-CM | POA: Diagnosis not present

## 2018-07-05 DIAGNOSIS — M9902 Segmental and somatic dysfunction of thoracic region: Secondary | ICD-10-CM | POA: Diagnosis not present

## 2018-07-06 DIAGNOSIS — M9901 Segmental and somatic dysfunction of cervical region: Secondary | ICD-10-CM | POA: Diagnosis not present

## 2018-07-06 DIAGNOSIS — M542 Cervicalgia: Secondary | ICD-10-CM | POA: Diagnosis not present

## 2018-07-06 DIAGNOSIS — M9902 Segmental and somatic dysfunction of thoracic region: Secondary | ICD-10-CM | POA: Diagnosis not present

## 2018-07-06 DIAGNOSIS — M546 Pain in thoracic spine: Secondary | ICD-10-CM | POA: Diagnosis not present

## 2018-07-08 DIAGNOSIS — M9902 Segmental and somatic dysfunction of thoracic region: Secondary | ICD-10-CM | POA: Diagnosis not present

## 2018-07-08 DIAGNOSIS — M9903 Segmental and somatic dysfunction of lumbar region: Secondary | ICD-10-CM | POA: Diagnosis not present

## 2018-07-08 DIAGNOSIS — M6240 Contracture of muscle, unspecified site: Secondary | ICD-10-CM | POA: Diagnosis not present

## 2018-07-08 DIAGNOSIS — M9901 Segmental and somatic dysfunction of cervical region: Secondary | ICD-10-CM | POA: Diagnosis not present

## 2018-07-13 DIAGNOSIS — M6240 Contracture of muscle, unspecified site: Secondary | ICD-10-CM | POA: Diagnosis not present

## 2018-07-13 DIAGNOSIS — M9902 Segmental and somatic dysfunction of thoracic region: Secondary | ICD-10-CM | POA: Diagnosis not present

## 2018-07-13 DIAGNOSIS — M9901 Segmental and somatic dysfunction of cervical region: Secondary | ICD-10-CM | POA: Diagnosis not present

## 2018-07-13 DIAGNOSIS — M9903 Segmental and somatic dysfunction of lumbar region: Secondary | ICD-10-CM | POA: Diagnosis not present

## 2018-07-14 ENCOUNTER — Ambulatory Visit: Payer: Self-pay | Admitting: Obstetrics & Gynecology

## 2018-07-21 DIAGNOSIS — M9902 Segmental and somatic dysfunction of thoracic region: Secondary | ICD-10-CM | POA: Diagnosis not present

## 2018-07-21 DIAGNOSIS — M9903 Segmental and somatic dysfunction of lumbar region: Secondary | ICD-10-CM | POA: Diagnosis not present

## 2018-07-21 DIAGNOSIS — M6240 Contracture of muscle, unspecified site: Secondary | ICD-10-CM | POA: Diagnosis not present

## 2018-07-21 DIAGNOSIS — M9901 Segmental and somatic dysfunction of cervical region: Secondary | ICD-10-CM | POA: Diagnosis not present

## 2018-08-04 DIAGNOSIS — M9902 Segmental and somatic dysfunction of thoracic region: Secondary | ICD-10-CM | POA: Diagnosis not present

## 2018-08-04 DIAGNOSIS — M9901 Segmental and somatic dysfunction of cervical region: Secondary | ICD-10-CM | POA: Diagnosis not present

## 2018-08-04 DIAGNOSIS — M9903 Segmental and somatic dysfunction of lumbar region: Secondary | ICD-10-CM | POA: Diagnosis not present

## 2018-08-04 DIAGNOSIS — M6240 Contracture of muscle, unspecified site: Secondary | ICD-10-CM | POA: Diagnosis not present

## 2018-08-11 DIAGNOSIS — M9902 Segmental and somatic dysfunction of thoracic region: Secondary | ICD-10-CM | POA: Diagnosis not present

## 2018-08-11 DIAGNOSIS — M9903 Segmental and somatic dysfunction of lumbar region: Secondary | ICD-10-CM | POA: Diagnosis not present

## 2018-08-11 DIAGNOSIS — M6240 Contracture of muscle, unspecified site: Secondary | ICD-10-CM | POA: Diagnosis not present

## 2018-08-11 DIAGNOSIS — M9901 Segmental and somatic dysfunction of cervical region: Secondary | ICD-10-CM | POA: Diagnosis not present

## 2018-08-18 DIAGNOSIS — M9901 Segmental and somatic dysfunction of cervical region: Secondary | ICD-10-CM | POA: Diagnosis not present

## 2018-08-18 DIAGNOSIS — M6240 Contracture of muscle, unspecified site: Secondary | ICD-10-CM | POA: Diagnosis not present

## 2018-08-18 DIAGNOSIS — M9903 Segmental and somatic dysfunction of lumbar region: Secondary | ICD-10-CM | POA: Diagnosis not present

## 2018-08-18 DIAGNOSIS — M9902 Segmental and somatic dysfunction of thoracic region: Secondary | ICD-10-CM | POA: Diagnosis not present

## 2018-09-07 DIAGNOSIS — M25571 Pain in right ankle and joints of right foot: Secondary | ICD-10-CM | POA: Diagnosis not present

## 2018-09-07 DIAGNOSIS — S93401A Sprain of unspecified ligament of right ankle, initial encounter: Secondary | ICD-10-CM | POA: Diagnosis not present

## 2018-09-15 DIAGNOSIS — M9903 Segmental and somatic dysfunction of lumbar region: Secondary | ICD-10-CM | POA: Diagnosis not present

## 2018-09-15 DIAGNOSIS — M9901 Segmental and somatic dysfunction of cervical region: Secondary | ICD-10-CM | POA: Diagnosis not present

## 2018-09-15 DIAGNOSIS — M9902 Segmental and somatic dysfunction of thoracic region: Secondary | ICD-10-CM | POA: Diagnosis not present

## 2018-09-15 DIAGNOSIS — M6240 Contracture of muscle, unspecified site: Secondary | ICD-10-CM | POA: Diagnosis not present

## 2018-09-28 DIAGNOSIS — S93401A Sprain of unspecified ligament of right ankle, initial encounter: Secondary | ICD-10-CM | POA: Diagnosis not present

## 2018-10-20 DIAGNOSIS — M9902 Segmental and somatic dysfunction of thoracic region: Secondary | ICD-10-CM | POA: Diagnosis not present

## 2018-10-20 DIAGNOSIS — M6240 Contracture of muscle, unspecified site: Secondary | ICD-10-CM | POA: Diagnosis not present

## 2018-10-20 DIAGNOSIS — M9901 Segmental and somatic dysfunction of cervical region: Secondary | ICD-10-CM | POA: Diagnosis not present

## 2018-10-20 DIAGNOSIS — M9903 Segmental and somatic dysfunction of lumbar region: Secondary | ICD-10-CM | POA: Diagnosis not present

## 2018-11-17 IMAGING — CR DG CHEST 2V
1 series · 2 of 2 positions shown · non-contrast
Comparison: 09/20/2017

CLINICAL DATA: Nonproductive cough and shortness of breath for the
past 2 months. History of asthma.

EXAM:
CHEST - 2 VIEW

[Series 1: dg chest 2 view · 0.14mm/px · 2 of 2 slices shown]
[im 1/2]
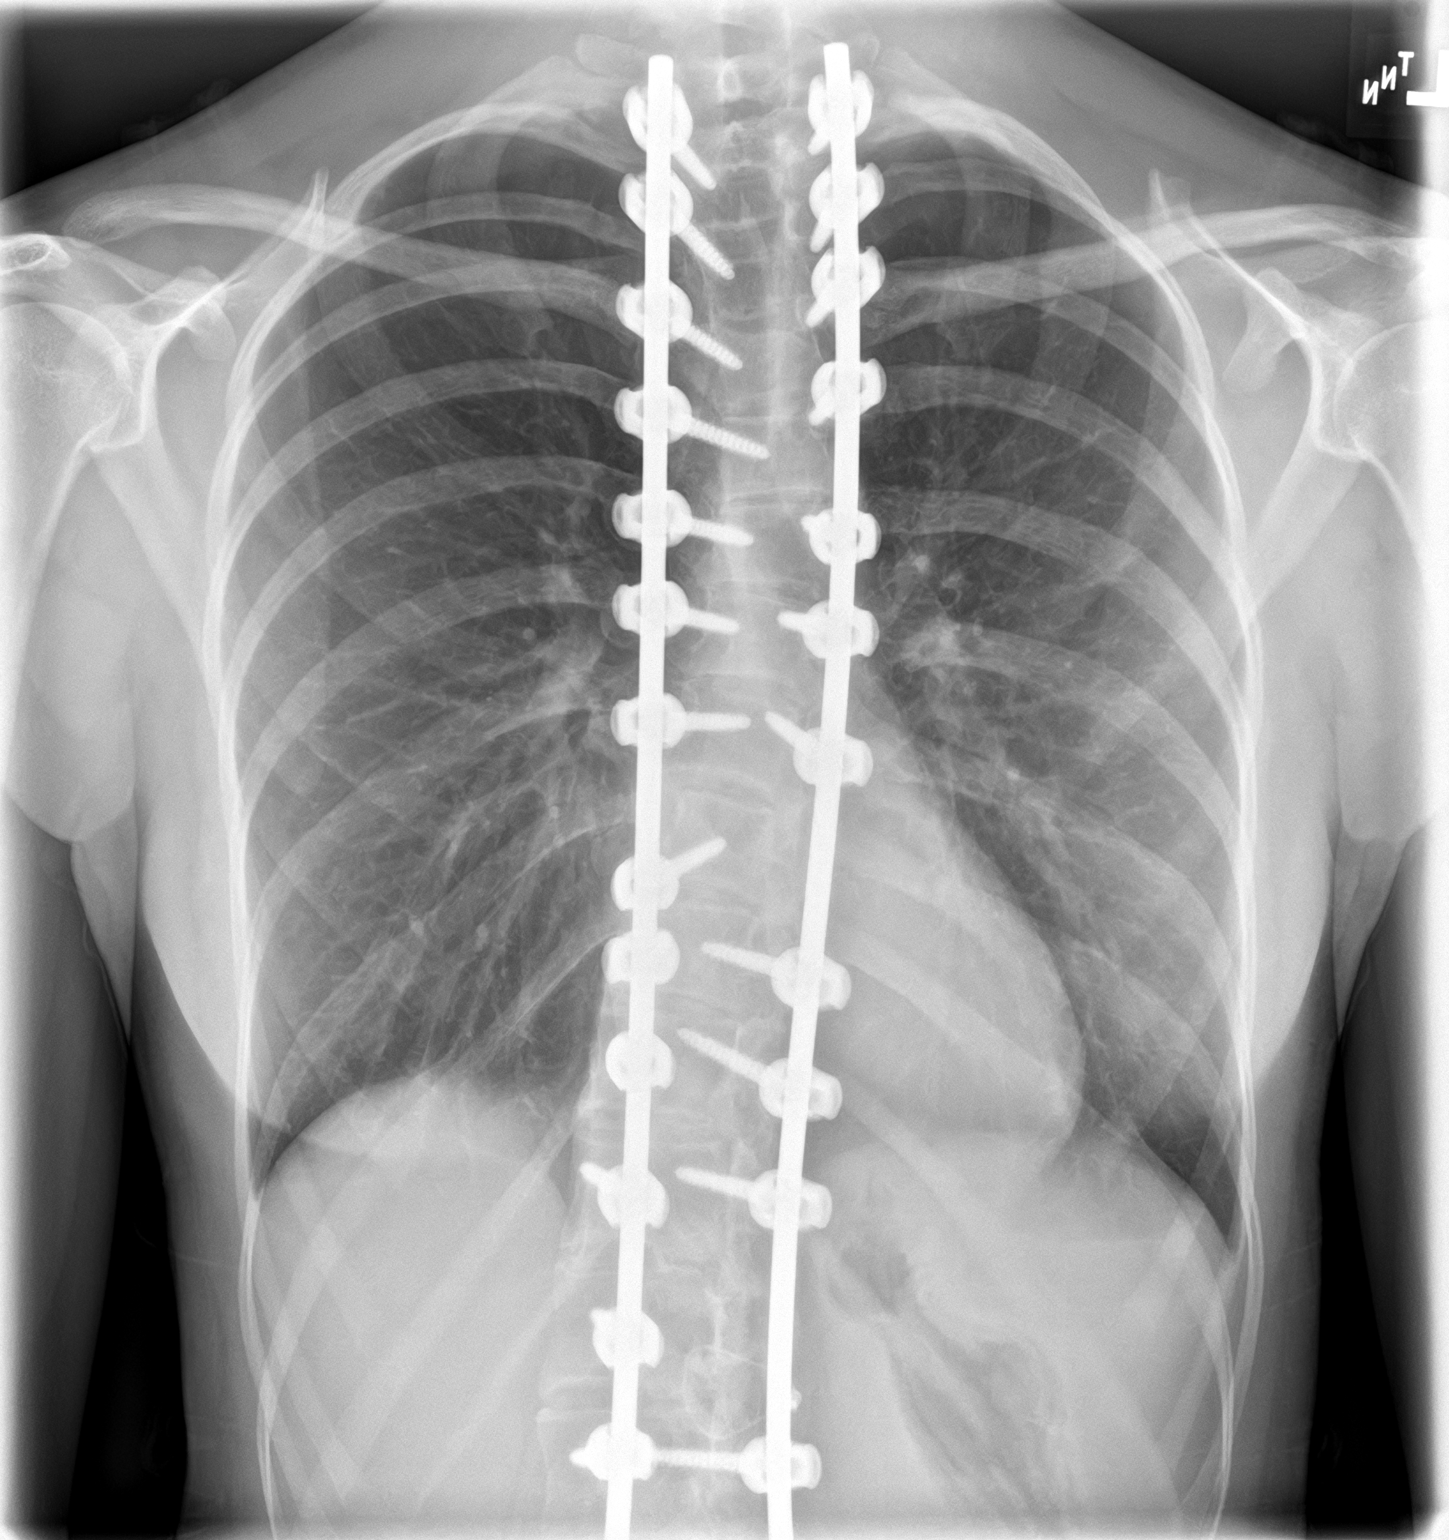
[im 2/2]
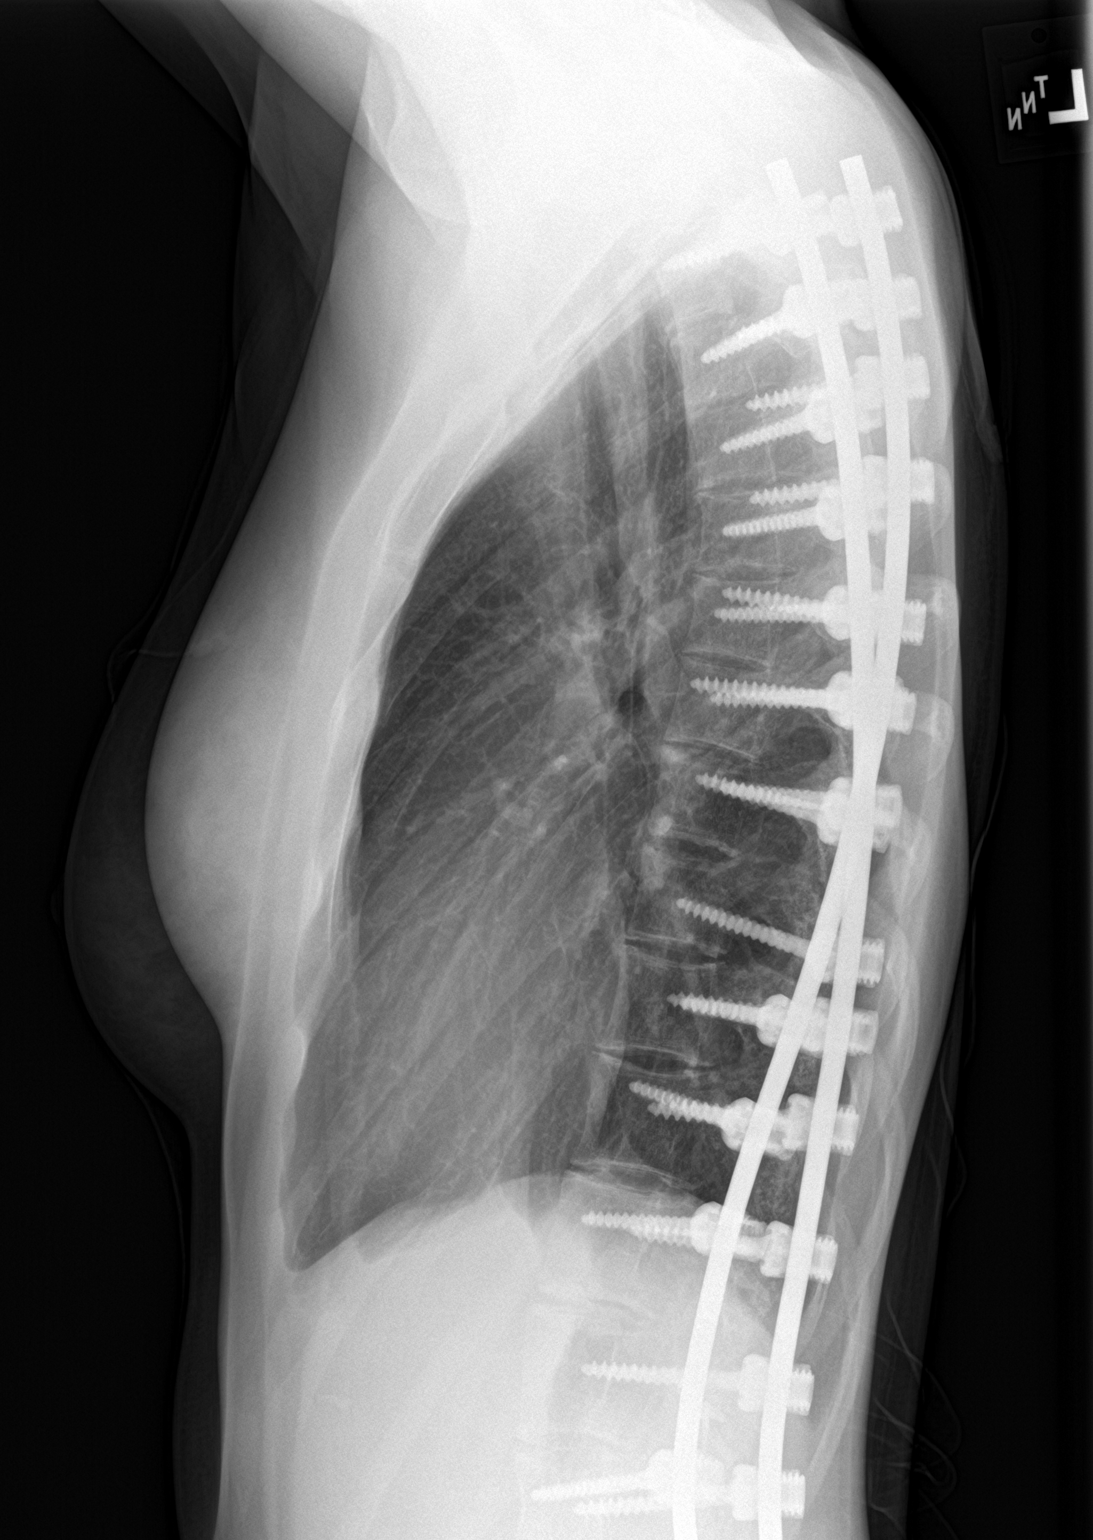

[2 of 2 positions shown; findings below may reference images not displayed]

FINDINGS: Unchanged cardiac silhouette and mediastinal contours. No focal
airspace opacities. No pleural effusion or pneumothorax. No focal
airspace opacities. No pleural effusion or pneumothorax. No evidence
of edema.

Stable sequela of long segment paraspinal thoracolumbar fusion,
incompletely evaluated, but without definitive evidence of hardware
failure or loosening.
IMPRESSION: 1. No acute cardiopulmonary disease.
2. Grossly stable sequela of long segment thoracolumbar paraspinal
fusion.

## 2018-11-18 DIAGNOSIS — M9903 Segmental and somatic dysfunction of lumbar region: Secondary | ICD-10-CM | POA: Diagnosis not present

## 2018-11-18 DIAGNOSIS — M9902 Segmental and somatic dysfunction of thoracic region: Secondary | ICD-10-CM | POA: Diagnosis not present

## 2018-11-18 DIAGNOSIS — M6240 Contracture of muscle, unspecified site: Secondary | ICD-10-CM | POA: Diagnosis not present

## 2018-11-18 DIAGNOSIS — M9901 Segmental and somatic dysfunction of cervical region: Secondary | ICD-10-CM | POA: Diagnosis not present

## 2018-12-23 ENCOUNTER — Telehealth: Payer: Self-pay

## 2018-12-23 NOTE — Telephone Encounter (Signed)
Pt has question about bc; is newly sexually active; wants to be sure bc will prevent any accidental pregnancies.  [phone]  Adv as long as she is taking bc at the same time every day it should prevent preg however she should use condoms as well to prevent STDs.

## 2019-01-12 DIAGNOSIS — M9901 Segmental and somatic dysfunction of cervical region: Secondary | ICD-10-CM | POA: Diagnosis not present

## 2019-01-12 DIAGNOSIS — M6240 Contracture of muscle, unspecified site: Secondary | ICD-10-CM | POA: Diagnosis not present

## 2019-01-12 DIAGNOSIS — M9903 Segmental and somatic dysfunction of lumbar region: Secondary | ICD-10-CM | POA: Diagnosis not present

## 2019-01-12 DIAGNOSIS — M9902 Segmental and somatic dysfunction of thoracic region: Secondary | ICD-10-CM | POA: Diagnosis not present

## 2019-01-25 DIAGNOSIS — M6240 Contracture of muscle, unspecified site: Secondary | ICD-10-CM | POA: Diagnosis not present

## 2019-01-25 DIAGNOSIS — M9902 Segmental and somatic dysfunction of thoracic region: Secondary | ICD-10-CM | POA: Diagnosis not present

## 2019-01-25 DIAGNOSIS — M9903 Segmental and somatic dysfunction of lumbar region: Secondary | ICD-10-CM | POA: Diagnosis not present

## 2019-01-25 DIAGNOSIS — M9901 Segmental and somatic dysfunction of cervical region: Secondary | ICD-10-CM | POA: Diagnosis not present

## 2019-02-01 DIAGNOSIS — F900 Attention-deficit hyperactivity disorder, predominantly inattentive type: Secondary | ICD-10-CM | POA: Diagnosis not present

## 2019-02-01 DIAGNOSIS — Z79899 Other long term (current) drug therapy: Secondary | ICD-10-CM | POA: Diagnosis not present

## 2019-02-01 DIAGNOSIS — Z0000 Encounter for general adult medical examination without abnormal findings: Secondary | ICD-10-CM | POA: Diagnosis not present

## 2019-02-16 DIAGNOSIS — M9903 Segmental and somatic dysfunction of lumbar region: Secondary | ICD-10-CM | POA: Diagnosis not present

## 2019-02-16 DIAGNOSIS — M9901 Segmental and somatic dysfunction of cervical region: Secondary | ICD-10-CM | POA: Diagnosis not present

## 2019-02-16 DIAGNOSIS — M9902 Segmental and somatic dysfunction of thoracic region: Secondary | ICD-10-CM | POA: Diagnosis not present

## 2019-02-16 DIAGNOSIS — M6240 Contracture of muscle, unspecified site: Secondary | ICD-10-CM | POA: Diagnosis not present

## 2019-03-01 DIAGNOSIS — Z79899 Other long term (current) drug therapy: Secondary | ICD-10-CM | POA: Diagnosis not present

## 2019-03-08 DIAGNOSIS — F900 Attention-deficit hyperactivity disorder, predominantly inattentive type: Secondary | ICD-10-CM | POA: Diagnosis not present

## 2019-03-25 DIAGNOSIS — D2239 Melanocytic nevi of other parts of face: Secondary | ICD-10-CM | POA: Diagnosis not present

## 2019-03-25 DIAGNOSIS — D485 Neoplasm of uncertain behavior of skin: Secondary | ICD-10-CM | POA: Diagnosis not present

## 2019-03-25 DIAGNOSIS — R58 Hemorrhage, not elsewhere classified: Secondary | ICD-10-CM | POA: Diagnosis not present

## 2019-04-11 DIAGNOSIS — F900 Attention-deficit hyperactivity disorder, predominantly inattentive type: Secondary | ICD-10-CM | POA: Diagnosis not present

## 2019-04-13 DIAGNOSIS — M9902 Segmental and somatic dysfunction of thoracic region: Secondary | ICD-10-CM | POA: Diagnosis not present

## 2019-04-13 DIAGNOSIS — M6240 Contracture of muscle, unspecified site: Secondary | ICD-10-CM | POA: Diagnosis not present

## 2019-04-13 DIAGNOSIS — M9901 Segmental and somatic dysfunction of cervical region: Secondary | ICD-10-CM | POA: Diagnosis not present

## 2019-04-13 DIAGNOSIS — M9903 Segmental and somatic dysfunction of lumbar region: Secondary | ICD-10-CM | POA: Diagnosis not present

## 2019-04-20 DIAGNOSIS — M9902 Segmental and somatic dysfunction of thoracic region: Secondary | ICD-10-CM | POA: Diagnosis not present

## 2019-04-20 DIAGNOSIS — M9903 Segmental and somatic dysfunction of lumbar region: Secondary | ICD-10-CM | POA: Diagnosis not present

## 2019-04-20 DIAGNOSIS — M6240 Contracture of muscle, unspecified site: Secondary | ICD-10-CM | POA: Diagnosis not present

## 2019-04-20 DIAGNOSIS — M9901 Segmental and somatic dysfunction of cervical region: Secondary | ICD-10-CM | POA: Diagnosis not present

## 2019-05-13 DIAGNOSIS — F900 Attention-deficit hyperactivity disorder, predominantly inattentive type: Secondary | ICD-10-CM | POA: Diagnosis not present

## 2019-05-25 DIAGNOSIS — M9902 Segmental and somatic dysfunction of thoracic region: Secondary | ICD-10-CM | POA: Diagnosis not present

## 2019-05-25 DIAGNOSIS — M6240 Contracture of muscle, unspecified site: Secondary | ICD-10-CM | POA: Diagnosis not present

## 2019-05-25 DIAGNOSIS — M9903 Segmental and somatic dysfunction of lumbar region: Secondary | ICD-10-CM | POA: Diagnosis not present

## 2019-05-25 DIAGNOSIS — M9901 Segmental and somatic dysfunction of cervical region: Secondary | ICD-10-CM | POA: Diagnosis not present

## 2019-06-04 DIAGNOSIS — L089 Local infection of the skin and subcutaneous tissue, unspecified: Secondary | ICD-10-CM | POA: Diagnosis not present

## 2019-06-04 DIAGNOSIS — W57XXXA Bitten or stung by nonvenomous insect and other nonvenomous arthropods, initial encounter: Secondary | ICD-10-CM | POA: Diagnosis not present

## 2019-06-04 DIAGNOSIS — S60562A Insect bite (nonvenomous) of left hand, initial encounter: Secondary | ICD-10-CM | POA: Diagnosis not present

## 2019-06-20 DIAGNOSIS — M9901 Segmental and somatic dysfunction of cervical region: Secondary | ICD-10-CM | POA: Diagnosis not present

## 2019-06-20 DIAGNOSIS — M9903 Segmental and somatic dysfunction of lumbar region: Secondary | ICD-10-CM | POA: Diagnosis not present

## 2019-06-20 DIAGNOSIS — M9902 Segmental and somatic dysfunction of thoracic region: Secondary | ICD-10-CM | POA: Diagnosis not present

## 2019-06-20 DIAGNOSIS — M6240 Contracture of muscle, unspecified site: Secondary | ICD-10-CM | POA: Diagnosis not present

## 2019-07-01 DIAGNOSIS — M6240 Contracture of muscle, unspecified site: Secondary | ICD-10-CM | POA: Diagnosis not present

## 2019-07-01 DIAGNOSIS — M9902 Segmental and somatic dysfunction of thoracic region: Secondary | ICD-10-CM | POA: Diagnosis not present

## 2019-07-01 DIAGNOSIS — M9901 Segmental and somatic dysfunction of cervical region: Secondary | ICD-10-CM | POA: Diagnosis not present

## 2019-07-01 DIAGNOSIS — M9903 Segmental and somatic dysfunction of lumbar region: Secondary | ICD-10-CM | POA: Diagnosis not present

## 2019-07-06 ENCOUNTER — Other Ambulatory Visit: Payer: Self-pay | Admitting: Obstetrics & Gynecology

## 2019-07-19 ENCOUNTER — Telehealth: Payer: Self-pay | Admitting: Obstetrics and Gynecology

## 2019-07-19 ENCOUNTER — Other Ambulatory Visit: Payer: Self-pay | Admitting: Obstetrics & Gynecology

## 2019-07-19 NOTE — Telephone Encounter (Signed)
Patient needs refill on bc, annual scheduled 8/10 but not enough to get to that point.  [place].

## 2019-07-20 ENCOUNTER — Other Ambulatory Visit: Payer: Self-pay

## 2019-07-20 MED ORDER — DROSPIRENONE-ETHINYL ESTRADIOL 3-0.02 MG PO TABS: 1.0000 | ORAL_TABLET | Freq: Every day | ORAL | 3 refills | Status: DC

## 2019-07-20 NOTE — Telephone Encounter (Signed)
Refill sent in for pt. 

## 2019-07-22 DIAGNOSIS — M9903 Segmental and somatic dysfunction of lumbar region: Secondary | ICD-10-CM | POA: Diagnosis not present

## 2019-07-22 DIAGNOSIS — M9902 Segmental and somatic dysfunction of thoracic region: Secondary | ICD-10-CM | POA: Diagnosis not present

## 2019-07-22 DIAGNOSIS — M9901 Segmental and somatic dysfunction of cervical region: Secondary | ICD-10-CM | POA: Diagnosis not present

## 2019-07-22 DIAGNOSIS — M6240 Contracture of muscle, unspecified site: Secondary | ICD-10-CM | POA: Diagnosis not present

## 2019-07-25 ENCOUNTER — Other Ambulatory Visit: Payer: Self-pay

## 2019-07-25 ENCOUNTER — Ambulatory Visit (INDEPENDENT_AMBULATORY_CARE_PROVIDER_SITE_OTHER): Payer: BC Managed Care – PPO | Admitting: Obstetrics and Gynecology

## 2019-07-25 ENCOUNTER — Encounter: Payer: Self-pay | Admitting: Obstetrics and Gynecology

## 2019-07-25 ENCOUNTER — Other Ambulatory Visit (HOSPITAL_COMMUNITY)
Admission: RE | Admit: 2019-07-25 | Discharge: 2019-07-25 | Disposition: A | Discharge status: Home / Self Care | Payer: BC Managed Care – PPO | Source: Ambulatory Visit | Attending: Obstetrics and Gynecology | Admitting: Obstetrics and Gynecology

## 2019-07-25 VITALS — BP 118/74 | Ht 64.0 in | Wt 112.0 lb

## 2019-07-25 DIAGNOSIS — Z1339 Encounter for screening examination for other mental health and behavioral disorders: Secondary | ICD-10-CM

## 2019-07-25 DIAGNOSIS — Z1331 Encounter for screening for depression: Secondary | ICD-10-CM

## 2019-07-25 DIAGNOSIS — Z01419 Encounter for gynecological examination (general) (routine) without abnormal findings: Secondary | ICD-10-CM | POA: Diagnosis not present

## 2019-07-25 DIAGNOSIS — Z113 Encounter for screening for infections with a predominantly sexual mode of transmission: Secondary | ICD-10-CM | POA: Diagnosis not present

## 2019-07-25 DIAGNOSIS — Z3041 Encounter for surveillance of contraceptive pills: Secondary | ICD-10-CM

## 2019-07-25 NOTE — Progress Notes (Signed)
Gynecology Annual Exam  PCP: Particia NearingLane, Rachel Elizabeth, PA-C  Chief Complaint  Patient presents with  . Annual Exam    History of Present Illness:  Ms. Jessica Everett is a 23 y.o. G0P0000 who LMP was Patient's last menstrual period was 07/24/2019., presents today for her annual examination.  Her menses are regular every 28-30 days, lasting 4 day(s).  Dysmenorrhea mild, occurring first 1-2 days of flow. She does have intermenstrual bleeding for the past cycle.  She is single partner, contraception - OCP (estrogen/progesterone).  Last Pap: 1 year  Results were: no abnormalities /neg HPV DNA not done Hx of STDs: none  There is no FH of breast cancer. There is no FH of ovarian cancer. The patient does not do self-breast exams.  Tobacco use: The patient denies current or previous tobacco use. Alcohol use: social drinker Exercise: moderately active  The patient wears seatbelts: yes.   The patient reports that domestic violence in her life is absent.   Past Medical History:  Diagnosis Date  . Asthma    as a child  . Scoliosis     Past Surgical History:  Procedure Laterality Date  . BACK SURGERY    . WRIST SURGERY      Prior to Admission medications   Medication Sig Start Date End Date Taking? Authorizing Provider  albuterol (PROVENTIL HFA;VENTOLIN HFA) 108 (90 Base) MCG/ACT inhaler Inhale 2-4 puffs by mouth every 4 hours as needed for wheezing, cough, and/or shortness of breath 09/20/17  Yes Loleta RoseForbach, Cory, MD  drospirenone-ethinyl estradiol (YAZ) 3-0.02 MG tablet Take 1 tablet by mouth daily. 07/20/19  Yes Nadara MustardHarris, Robert P, MD  ADDERALL XR 15 MG 24 hr capsule TK 1 C PO ONCE D 05/13/19   [provider]    Allergies  Allergen Reactions  . Penicillins   . Tessalon [Benzonatate] Other (See Comments)    Tingling and numbness in legs    Obstetric History: G0P0000  Social History   Socioeconomic History  . Marital status: Single    Spouse name: Not on file  . Number of  children: Not on file  . Years of education: Not on file  . Highest education level: Not on file  Occupational History  . Not on file  Social Needs  . Financial resource strain: Not on file  . Food insecurity    Worry: Not on file    Inability: Not on file  . Transportation needs    Medical: Not on file    Non-medical: Not on file  Tobacco Use  . Smoking status: Never Smoker  . Smokeless tobacco: Never Used  Substance and Sexual Activity  . Alcohol use: No  . Drug use: No  . Sexual activity: Not Currently    Birth control/protection: Pill  Lifestyle  . Physical activity    Days per week: Not on file    Minutes per session: Not on file  . Stress: Not on file  Relationships  . Social Musicianconnections    Talks on phone: Not on file    Gets together: Not on file    Attends religious service: Not on file    Active member of club or organization: Not on file    Attends meetings of clubs or organizations: Not on file    Relationship status: Not on file  . Intimate partner violence    Fear of current or ex partner: Not on file    Emotionally abused: Not on file    Physically abused: Not  on file    Forced sexual activity: Not on file  Other Topics Concern  . Not on file  Social History Narrative  . Not on file    Family History  Problem Relation Age of Onset  . Cancer Maternal Grandfather   . Heart disease Maternal Grandfather   . Stroke Maternal Grandfather     Review of Systems  Constitutional: Negative.   HENT: Negative.   Eyes: Negative.   Respiratory: Negative.   Cardiovascular: Negative.   Gastrointestinal: Negative.   Genitourinary: Negative.   Musculoskeletal: Negative.   Skin: Negative.   Neurological: Negative.   Psychiatric/Behavioral: Negative.      Physical Exam BP 118/74   Ht 5\' 4"  (1.626 m)   Wt 112 lb (50.8 kg)   LMP 07/24/2019   BMI 19.22 kg/m    Physical Exam Constitutional:      General: She is not in acute distress.    Appearance:  Normal appearance. She is well-developed.  Genitourinary:     Pelvic exam was performed with patient supine.     Vulva, urethra, bladder and uterus normal.     No inguinal adenopathy present in the right or left side.    No signs of injury in the vagina.     No vaginal discharge, erythema, tenderness or bleeding.     No cervical motion tenderness, discharge, lesion or polyp.     Uterus is mobile.     Uterus is not enlarged or tender.     No uterine mass detected.    Uterus is anteverted.     No right or left adnexal mass present.     Right adnexa not tender or full.     Left adnexa not tender or full.  HENT:     Head: Normocephalic and atraumatic.  Eyes:     General: No scleral icterus.    Conjunctiva/sclera: Conjunctivae normal.  Neck:     Musculoskeletal: Normal range of motion and neck supple.     Thyroid: No thyromegaly.  Cardiovascular:     Rate and Rhythm: Normal rate and regular rhythm.     Heart sounds: No murmur. No friction rub. No gallop.   Pulmonary:     Effort: Pulmonary effort is normal. No respiratory distress.     Breath sounds: Normal breath sounds. No wheezing or rales.  Chest:     Breasts:        Right: No inverted nipple, mass, nipple discharge, skin change or tenderness.        Left: No inverted nipple, mass, nipple discharge, skin change or tenderness.  Abdominal:     General: Bowel sounds are normal. There is no distension.     Palpations: Abdomen is soft. There is no mass.     Tenderness: There is no abdominal tenderness. There is no guarding or rebound.  Musculoskeletal: Normal range of motion.        General: No swelling or tenderness.  Lymphadenopathy:     Cervical: No cervical adenopathy.     Lower Body: No right inguinal adenopathy. No left inguinal adenopathy.  Neurological:     General: No focal deficit present.     Mental Status: She is alert and oriented to person, place, and time.     Cranial Nerves: No cranial nerve deficit.  Skin:     General: Skin is warm and dry.     Findings: No erythema or rash.  Psychiatric:        Mood and Affect:  Mood normal.        Behavior: Behavior normal.        Judgment: Judgment normal.     Female chaperone present for pelvic and breast  portions of the physical exam  Results: AUDIT Questionnaire (screen for alcoholism): 4 PHQ-9: 3   Assessment: 23 y.o. G0P0000 female here for routine annual gynecologic examination  Plan: Problem List Items Addressed This Visit    None    Visit Diagnoses    Women's annual routine gynecological examination    -  Primary   Relevant Orders   Cervicovaginal ancillary only   Screening for depression       Screening for alcoholism       Screen for STD (sexually transmitted disease)       Relevant Orders   Cervicovaginal ancillary only   Encounter for surveillance of contraceptive pills          Screening: -- Blood pressure screen normal -- Weight screening: normal -- Depression screening negative (PHQ-9) -- Nutrition: normal -- cholesterol screening: not due for screening -- osteoporosis screening: not due -- tobacco screening: not using -- alcohol screening: AUDIT questionnaire indicates low-risk usage. -- family history of breast cancer screening: done. not at high risk. -- no evidence of domestic violence or intimate partner violence. -- STD screening: gonorrhea/chlamydia NAAT collected -- pap smear not collected per ASCCP guidelines  Contraceptive management: She would like to continue her current medication. But, if her mid-cycle bleeding continues, she'd like to change to Yazmin (30 mcg estrogen).  Will renew for one year when calls for refill in 3 months.   Thomasene MohairStephen Meadow Abramo, MD 07/25/2019 2:36 PM

## 2019-07-27 LAB — CERVICOVAGINAL ANCILLARY ONLY
Chlamydia: NEGATIVE
Neisseria Gonorrhea: NEGATIVE
Trichomonas: NEGATIVE

## 2019-08-04 DIAGNOSIS — M9903 Segmental and somatic dysfunction of lumbar region: Secondary | ICD-10-CM | POA: Diagnosis not present

## 2019-08-04 DIAGNOSIS — M9902 Segmental and somatic dysfunction of thoracic region: Secondary | ICD-10-CM | POA: Diagnosis not present

## 2019-08-04 DIAGNOSIS — M9901 Segmental and somatic dysfunction of cervical region: Secondary | ICD-10-CM | POA: Diagnosis not present

## 2019-08-04 DIAGNOSIS — M6240 Contracture of muscle, unspecified site: Secondary | ICD-10-CM | POA: Diagnosis not present

## 2019-08-17 ENCOUNTER — Telehealth: Payer: Self-pay

## 2019-08-17 NOTE — Telephone Encounter (Signed)
Patient states she is still having break thru bleeding on Yaz. She is requesting a higher dose OCP. [phone]

## 2019-08-17 NOTE — Telephone Encounter (Signed)
Spoke w/pt. Notified SDJ out of office today and will return to office tomorrow.

## 2019-08-18 ENCOUNTER — Other Ambulatory Visit: Payer: Self-pay | Admitting: Obstetrics and Gynecology

## 2019-08-18 DIAGNOSIS — Z3041 Encounter for surveillance of contraceptive pills: Secondary | ICD-10-CM

## 2019-08-18 MED ORDER — DROSPIRENONE-ETHINYL ESTRADIOL 3-0.03 MG PO TABS: 1.0000 | ORAL_TABLET | Freq: Every day | ORAL | 4 refills | Status: DC

## 2019-08-18 NOTE — Telephone Encounter (Signed)
Jessica Everett (estrogen increase from 20 to 30 mcg) sent to [facility] in [place].

## 2019-08-19 NOTE — Telephone Encounter (Signed)
LMVM to notify pt request sent in.

## 2019-08-24 DIAGNOSIS — M6240 Contracture of muscle, unspecified site: Secondary | ICD-10-CM | POA: Diagnosis not present

## 2019-08-24 DIAGNOSIS — M9903 Segmental and somatic dysfunction of lumbar region: Secondary | ICD-10-CM | POA: Diagnosis not present

## 2019-08-24 DIAGNOSIS — M9902 Segmental and somatic dysfunction of thoracic region: Secondary | ICD-10-CM | POA: Diagnosis not present

## 2019-08-24 DIAGNOSIS — M9901 Segmental and somatic dysfunction of cervical region: Secondary | ICD-10-CM | POA: Diagnosis not present

## 2019-10-14 DIAGNOSIS — M9901 Segmental and somatic dysfunction of cervical region: Secondary | ICD-10-CM | POA: Diagnosis not present

## 2019-10-14 DIAGNOSIS — M9902 Segmental and somatic dysfunction of thoracic region: Secondary | ICD-10-CM | POA: Diagnosis not present

## 2019-10-14 DIAGNOSIS — M6240 Contracture of muscle, unspecified site: Secondary | ICD-10-CM | POA: Diagnosis not present

## 2019-10-14 DIAGNOSIS — M9903 Segmental and somatic dysfunction of lumbar region: Secondary | ICD-10-CM | POA: Diagnosis not present

## 2019-12-05 DIAGNOSIS — M9903 Segmental and somatic dysfunction of lumbar region: Secondary | ICD-10-CM | POA: Diagnosis not present

## 2019-12-05 DIAGNOSIS — M9901 Segmental and somatic dysfunction of cervical region: Secondary | ICD-10-CM | POA: Diagnosis not present

## 2019-12-05 DIAGNOSIS — M9902 Segmental and somatic dysfunction of thoracic region: Secondary | ICD-10-CM | POA: Diagnosis not present

## 2019-12-05 DIAGNOSIS — M6240 Contracture of muscle, unspecified site: Secondary | ICD-10-CM | POA: Diagnosis not present

## 2019-12-12 DIAGNOSIS — M6240 Contracture of muscle, unspecified site: Secondary | ICD-10-CM | POA: Diagnosis not present

## 2019-12-12 DIAGNOSIS — M9902 Segmental and somatic dysfunction of thoracic region: Secondary | ICD-10-CM | POA: Diagnosis not present

## 2019-12-12 DIAGNOSIS — M9901 Segmental and somatic dysfunction of cervical region: Secondary | ICD-10-CM | POA: Diagnosis not present

## 2019-12-12 DIAGNOSIS — M9903 Segmental and somatic dysfunction of lumbar region: Secondary | ICD-10-CM | POA: Diagnosis not present

## 2019-12-27 ENCOUNTER — Telehealth: Payer: Self-pay

## 2019-12-27 NOTE — Telephone Encounter (Signed)
Pt requesting a stronger/different OCP. States she is still having BTB . Pt had annual 07/2019. Please advise

## 2019-12-28 ENCOUNTER — Other Ambulatory Visit: Payer: Self-pay | Admitting: Obstetrics and Gynecology

## 2019-12-28 DIAGNOSIS — Z3041 Encounter for surveillance of contraceptive pills: Secondary | ICD-10-CM

## 2019-12-28 MED ORDER — NORGESTIMATE-ETH ESTRADIOL 0.25-35 MG-MCG PO TABS: 1.0000 | ORAL_TABLET | Freq: Every day | ORAL | 3 refills | Status: DC

## 2019-12-28 NOTE — Telephone Encounter (Signed)
Sprintec sent to pharmacy of record.

## 2020-03-12 ENCOUNTER — Telehealth: Payer: Self-pay

## 2020-03-12 NOTE — Telephone Encounter (Signed)
Pt is on new bc; she start to bleed every morning for 3-4hrs and at 7pm for 3-4 hrs.  What's going on?  [phone]

## 2020-03-14 NOTE — Telephone Encounter (Signed)
Please let me know when pt returns my call.  

## 2020-03-19 ENCOUNTER — Telehealth: Payer: Self-pay

## 2020-03-19 NOTE — Telephone Encounter (Signed)
Pt aware this can be normal while starting a new OCP. Her body is probably still getting used to it. Advised pt to give it one more month. She wil call back if not better

## 2020-03-19 NOTE — Telephone Encounter (Signed)
Trying to call and follow up with pt. No answer. Please let me know when /if she calls back. Have not been able to get in touch with her

## 2020-05-07 ENCOUNTER — Telehealth: Payer: Self-pay

## 2020-05-07 NOTE — Telephone Encounter (Signed)
Pt calling triage, she is still bleeding and having issues with cycle. Please advise. She is wanting to know if there is stronger dose she can take? Aware via vm that you are out of office until tomorrow.

## 2020-05-08 NOTE — Telephone Encounter (Signed)
Please let patient know that she is taking the highest dose medication. We should probably have her come in for a visit and ultrasound.  Let me know how she feels about this and we can get it set up. thanks

## 2020-05-10 ENCOUNTER — Other Ambulatory Visit: Payer: Self-pay | Admitting: Obstetrics and Gynecology

## 2020-05-10 DIAGNOSIS — N921 Excessive and frequent menstruation with irregular cycle: Secondary | ICD-10-CM

## 2020-05-10 NOTE — Telephone Encounter (Signed)
Left msg with pt. Please get her scheduled when she calls back if she wants an u/s and visit with SDJ

## 2020-05-10 NOTE — Telephone Encounter (Signed)
Order placed. Thanks.

## 2020-05-10 NOTE — Telephone Encounter (Signed)
Patient is schedule for Monday, 05/28/20 for ultrasound and follow up with SDJ. Please place ultrasound order. Thank you!

## 2020-05-28 ENCOUNTER — Ambulatory Visit (INDEPENDENT_AMBULATORY_CARE_PROVIDER_SITE_OTHER): Payer: BC Managed Care – PPO | Admitting: Obstetrics and Gynecology

## 2020-05-28 ENCOUNTER — Other Ambulatory Visit: Payer: Self-pay

## 2020-05-28 ENCOUNTER — Encounter: Payer: Self-pay | Admitting: Obstetrics and Gynecology

## 2020-05-28 ENCOUNTER — Ambulatory Visit (INDEPENDENT_AMBULATORY_CARE_PROVIDER_SITE_OTHER): Payer: BC Managed Care – PPO

## 2020-05-28 VITALS — BP 90/60 | Ht 64.0 in | Wt 116.0 lb

## 2020-05-28 DIAGNOSIS — N921 Excessive and frequent menstruation with irregular cycle: Secondary | ICD-10-CM | POA: Diagnosis not present

## 2020-05-28 NOTE — Progress Notes (Signed)
Gynecology Ultrasound Follow Up   Chief Complaint  Patient presents with  . Follow-up    u/s  Breakthrough bleeding on combined OCPs   History of Present Illness: Patient is a 24 y.o. female who presents today for ultrasound evaluation of the above.  Ultrasound demonstrates the following findings Adnexa: no masses seen  Uterus: anteverted with endometrial stripe  2.6 mm Additional: no abnormalities. Possibly anterior myometrial asymmetry, but not definitive.   She has had breakthrough bleeding on multiple types of contraception. Most recently on Sprintec.  She is interested in whatever method of controlling her bleeding will work.  She does not seem interested in IUD. She may consider NuvaRing if I will manage it the first few times, which I'm willing to do.  We discussed Nexplanon, but she does not necessarily want that either.   Past Medical History:  Diagnosis Date  . Asthma    as a child  . Scoliosis     Past Surgical History:  Procedure Laterality Date  . BACK SURGERY    . WRIST SURGERY      Family History  Problem Relation Age of Onset  . Cancer Maternal Grandfather   . Heart disease Maternal Grandfather   . Stroke Maternal Grandfather     Social History   Socioeconomic History  . Marital status: Single    Spouse name: Not on file  . Number of children: Not on file  . Years of education: Not on file  . Highest education level: Not on file  Occupational History  . Not on file  Tobacco Use  . Smoking status: Never Smoker  . Smokeless tobacco: Never Used  Vaping Use  . Vaping Use: Never used  Substance and Sexual Activity  . Alcohol use: No  . Drug use: No  . Sexual activity: Yes    Birth control/protection: Pill  Other Topics Concern  . Not on file  Social History Narrative  . Not on file   Social Determinants of Health   Financial Resource Strain:   . Difficulty of Paying Living Expenses:   Food Insecurity:   . Worried About Sales executive in the Last Year:   . Arboriculturist in the Last Year:   Transportation Needs:   . Film/video editor (Medical):   Marland Kitchen Lack of Transportation (Non-Medical):   Physical Activity:   . Days of Exercise per Week:   . Minutes of Exercise per Session:   Stress:   . Feeling of Stress :   Social Connections:   . Frequency of Communication with Friends and Family:   . Frequency of Social Gatherings with Friends and Family:   . Attends Religious Services:   . Active Member of Clubs or Organizations:   . Attends Archivist Meetings:   Marland Kitchen Marital Status:   Intimate Partner Violence:   . Fear of Current or Ex-Partner:   . Emotionally Abused:   Marland Kitchen Physically Abused:   . Sexually Abused:     Allergies  Allergen Reactions  . Penicillins   . Tessalon [Benzonatate] Other (See Comments)    Tingling and numbness in legs    Prior to Admission medications   Medication Sig Start Date End Date Taking? Authorizing Provider  ADDERALL XR 15 MG 24 hr capsule TK 1 C PO ONCE D 05/13/19  Yes [provider]  norgestimate-ethinyl estradiol ([place] 28) 0.25-35 MG-MCG tablet Take 1 tablet by mouth daily. 12/28/19 03/21/20  Will Bonnet,  MD    Physical Exam BP 90/60   Ht 5\' 4"  (1.626 m)   Wt 116 lb (52.6 kg)   BMI 19.91 kg/m    General: NAD HEENT: normocephalic, anicteric Pulmonary: No increased work of breathing Extremities: no edema, erythema, or tenderness Neurologic: Grossly intact, normal gait Psychiatric: mood appropriate, affect full  Imaging Results PELVIS TRANSVAGINAL NON-OB (TV ONLY)  Result Date: 05/28/2020 Patient Name: Jessica Everett DOB: March 31, 1996 MRN: [id number] ULTRASOUND REPORT Location: [facility] Date of Service: 05/28/2020 Indications:Abnormal Uterine Bleeding Findings: The uterus is anteverted and measures 7.3 x 3.9 x 3.2 cm. Echo texture is homogenous without evidence of focal masses. The Endometrium measures 2.6 mm. Right Ovary measures 3.1 x  1.1 x 1.3 cm. It is normal in appearance. Left Ovary measures 2.3 x 1.8 x 1.4 cm. It is normal in appearance. Survey of the adnexa demonstrates no adnexal masses. There is no free fluid in the cul de sac. Impression: 1. Normal pelvic ultrasound. 05/30/2020, RT The ultrasound images and findings were reviewed by me and I agree with the above report. Deanna Artis, MD, [facility], [facility] 05/28/2020 10:28 AM       Assessment: 24 y.o. G0P0000  1. Menorrhagia with irregular cycle      Plan: Problem List Items Addressed This Visit    None    Visit Diagnoses    Menorrhagia with irregular cycle    -  Primary   Relevant Orders   Von Willebrand panel     Discussed management options for abnormal uterine bleeding including expectant, NSAIDs, tranexamic acid (Lysteda), oral progesterone (Provera, norethindrone, megace), Depo Provera, Levonorgestrel containing IUD, endometrial ablation (Novasure) or hysterectomy as definitive surgical management.  Discussed risks and benefits of each method.   Final management decision will hinge on results of patient's work up and whether an underlying etiology for the patients bleeding symptoms can be discerned.  We will conduct a basic work up examining using the PALM-COIEN classification system.  Bleeding precautions reviewed. She is interested in hormonal and possible surgical options. She states that she is absolutely not interested in having any children given her back issues.  We discuss this is great detail.  For now will get vWD panel. She will take a break from contraception and use back-up methods.  May consider other forms of hormonal. She will consider and we'll decide once we get the results of the vWD panel back.   A total of 30 minutes were spent face-to-face with the patient as well as preparation, review, communication, and documentation during this encounter.    30, MD, [facility], [facility] 05/28/2020 5:57 PM

## 2020-05-30 ENCOUNTER — Telehealth: Payer: Self-pay

## 2020-05-30 LAB — COAG STUDIES INTERP REPORT

## 2020-05-30 LAB — VON WILLEBRAND PANEL
Factor VIII Activity: 99 % (ref 56–140)
Von Willebrand Ag: 80 % (ref 50–200)
Von Willebrand Factor: 64 % (ref 50–200)

## 2020-05-30 NOTE — Telephone Encounter (Signed)
Pt calling; mom wants to come c pt to discuss bc options for pt.  [phone]

## 2020-05-30 NOTE — Telephone Encounter (Signed)
Patient is scheduled for 07/27/20 with SDJ

## 2020-06-04 ENCOUNTER — Telehealth: Payer: Self-pay

## 2020-06-04 ENCOUNTER — Telehealth: Payer: Self-pay | Admitting: Obstetrics and Gynecology

## 2020-06-04 NOTE — Telephone Encounter (Signed)
Pt called.  See note.

## 2020-06-04 NOTE — Telephone Encounter (Signed)
Discussed negative vWD results. Her mother was present on the phone conversation. She is still considering hysterectomy. I continue to have her think very hard about the repercussions of this decision. I would support her, if that is her decision. I just want to make sure she has really thought through the implications of this permanent solution.   We briefly discussed the patch, which would deliver a higher dose of estrogen. However, she states (and so does her mother) that she is very sensitive to the patch and has had skin irritation to the patch in the past.

## 2020-06-04 NOTE — Telephone Encounter (Signed)
Left detailed msg has appt made for 8/13th c SDJ; appt made same day as pt's call.  If needs a sooner appt to call and reschedule.

## 2020-06-04 NOTE — Telephone Encounter (Signed)
Pt returned call; adv of appt 8/13; adv it's between her and her mom as to whether her mom cones in with her; things have lightened up a little re covid.  Pt asked if blood work is back.  Adv it was but I could not tell her what it means.  Pt aware will send msg to [facility] for results.

## 2020-06-28 ENCOUNTER — Emergency Department
Admission: EM | Admit: 2020-06-28 | Discharge: 2020-06-28 | Disposition: A | Discharge status: Home / Self Care | Payer: BC Managed Care – PPO | Attending: Emergency Medicine | Admitting: Emergency Medicine

## 2020-06-28 ENCOUNTER — Other Ambulatory Visit: Payer: Self-pay

## 2020-06-28 ENCOUNTER — Emergency Department: Payer: BC Managed Care – PPO

## 2020-06-28 DIAGNOSIS — J45909 Unspecified asthma, uncomplicated: Secondary | ICD-10-CM | POA: Insufficient documentation

## 2020-06-28 DIAGNOSIS — R202 Paresthesia of skin: Secondary | ICD-10-CM

## 2020-06-28 LAB — CBC
HCT: 36.6 % (ref 36.0–46.0)
Hemoglobin: 11.5 g/dL — ABNORMAL LOW (ref 12.0–15.0)
MCH: 28.4 pg (ref 26.0–34.0)
MCHC: 31.4 g/dL (ref 30.0–36.0)
MCV: 90.4 fL (ref 80.0–100.0)
Platelets: 181 10*3/uL (ref 150–400)
RBC: 4.05 MIL/uL (ref 3.87–5.11)
RDW: 14.1 % (ref 11.5–15.5)
WBC: 7.5 10*3/uL (ref 4.0–10.5)
nRBC: 0 % (ref 0.0–0.2)

## 2020-06-28 LAB — COMPREHENSIVE METABOLIC PANEL WITH GFR
ALT: 21 U/L (ref 0–44)
AST: 24 U/L (ref 15–41)
Albumin: 4 g/dL (ref 3.5–5.0)
Alkaline Phosphatase: 52 U/L (ref 38–126)
Anion gap: 7 (ref 5–15)
BUN: 9 mg/dL (ref 6–20)
CO2: 23 mmol/L (ref 22–32)
Calcium: 8.5 mg/dL — ABNORMAL LOW (ref 8.9–10.3)
Chloride: 106 mmol/L (ref 98–111)
Creatinine, Ser: 0.93 mg/dL (ref 0.44–1.00)
GFR calc Af Amer: >60 mL/min
GFR calc non Af Amer: >60 mL/min
Glucose, Bld: 93 mg/dL (ref 70–99)
Potassium: 4.4 mmol/L (ref 3.5–5.1)
Sodium: 136 mmol/L (ref 135–145)
Total Bilirubin: 0.8 mg/dL (ref 0.3–1.2)
Total Protein: 6.7 g/dL (ref 6.5–8.1)

## 2020-06-28 LAB — URINALYSIS, COMPLETE (UACMP) WITH MICROSCOPIC
Bacteria, UA: NONE SEEN
Glucose, UA: NEGATIVE mg/dL
Hgb urine dipstick: NEGATIVE
Ketones, ur: NEGATIVE mg/dL
Nitrite: NEGATIVE
Protein, ur: 30 mg/dL — AB
Specific Gravity, Urine: 1.03 (ref 1.005–1.030)
pH: 5 (ref 5.0–8.0)

## 2020-06-28 LAB — POCT PREGNANCY, URINE: Preg Test, Ur: NEGATIVE

## 2020-06-28 MED ORDER — DEXAMETHASONE SODIUM PHOSPHATE 10 MG/ML IJ SOLN
8.0000 mg | Freq: Once | INTRAMUSCULAR | Status: AC
  Administered 2020-06-28: 8 mg via INTRAVENOUS
  Filled 2020-06-28: qty 1

## 2020-06-28 MED ORDER — GADOBUTROL 1 MMOL/ML IV SOLN
5.0000 mL | Freq: Once | INTRAVENOUS | Status: AC | PRN
  Administered 2020-06-28: 5 mL via INTRAVENOUS

## 2020-06-28 NOTE — ED Provider Notes (Signed)
[facility] Emergency Department Provider Note   ____________________________________________    I have reviewed the triage vital signs and the nursing notes.   HISTORY  Chief Complaint Tingling     HPI Jessica Everett is a 24 y.o. female with a history of scoliosis requiring scoliosis surgery 10 years ago who presents with complaints of tingling all over her body.  Patient reports for some time now she has had frequent tingling sensation in her lower extremities however over the last several days it has been in her entire body including her head.  She reports occasionally her hands go numb as do her lower extremities.  Denies weakness.  No fevers or chills or nausea or vomiting.  No chest pain.  No back pain.  In the past she was on gabapentin for similar symptoms but no longer.  She does have an appoint with neurology at the end of this month.  She has been taking B12  Past Medical History:  Diagnosis Date  . Asthma    as a child  . Scoliosis     Patient Active Problem List   Diagnosis Date Noted  . Asthma 10/14/2017    Past Surgical History:  Procedure Laterality Date  . BACK SURGERY    . WRIST SURGERY      Prior to Admission medications   Medication Sig Start Date End Date Taking? Authorizing Provider  ADDERALL XR 15 MG 24 hr capsule TK 1 C PO ONCE D 05/13/19   [provider]  norgestimate-ethinyl estradiol (SPRINTEC 28) 0.25-35 MG-MCG tablet Take 1 tablet by mouth daily. 12/28/19 03/21/20  Conard Novak, MD     Allergies Penicillins and Tessalon [benzonatate]  Family History  Problem Relation Age of Onset  . Cancer Maternal Grandfather   . Heart disease Maternal Grandfather   . Stroke Maternal Grandfather     Social History Social History   Tobacco Use  . Smoking status: Never Smoker  . Smokeless tobacco: Never Used  Vaping Use  . Vaping Use: Never used  Substance Use Topics  . Alcohol use: No  . Drug use: No     Review of Systems  Constitutional: No fever/chills Eyes: No visual changes.  ENT: No neck pain Cardiovascular: Denies chest pain. Respiratory: Denies shortness of breath.   Musculoskeletal: Negative for back pain. Skin: Negative for rash. Neurological: As above   ____________________________________________   PHYSICAL EXAM:  VITAL SIGNS: ED Triage Vitals [06/28/20 1421]  Enc Vitals Group     BP 100/80     Pulse Rate (!) 58     Resp 16     Temp 98 F (36.7 C)     Temp Source Oral     SpO2 100 %     Weight 52.2 kg (115 lb)     Height 1.626 m (5\' 4" )     Head Circumference      Peak Flow      Pain Score 0     Pain Loc      Pain Edu?      Excl. in GC?     Constitutional: Alert and oriented.  Eyes: Conjunctivae are normal.  PERRLA, EOMI  Nose: No congestion/rhinnorhea. Mouth/Throat: Mucous membranes are moist.   Neck:  Painless ROM Cardiovascular: Normal rate, regular rhythm. Grossly normal heart sounds.  Good peripheral circulation. Respiratory: Normal respiratory effort.  No retractions. Gastrointestinal: Soft and nontender. No distention.    Musculoskeletal:  Warm and well perfused Neurologic:  Normal speech and language. No gross focal neurologic deficits are appreciated.  Normal strength in all extremities, cranial nerves II to XII are normal Skin:  Skin is warm, dry and intact. No rash noted. Psychiatric: Mood and affect are normal. Speech and behavior are normal.  ____________________________________________   LABS (all labs ordered are listed, but only abnormal results are displayed)  Labs Reviewed  URINALYSIS, COMPLETE (UACMP) WITH MICROSCOPIC - Abnormal; Notable for the following components:      Result Value   Color, Urine YELLOW (*)    APPearance HAZY (*)    Bilirubin Urine SMALL (*)    Protein, ur 30 (*)    Leukocytes,Ua TRACE (*)    All other components within normal limits  COMPREHENSIVE METABOLIC PANEL - Abnormal; Notable for the  following components:   Calcium 8.5 (*)    All other components within normal limits  CBC - Abnormal; Notable for the following components:   Hemoglobin 11.5 (*)    All other components within normal limits  POC URINE PREG, ED  POCT PREGNANCY, URINE   ____________________________________________  EKG  None ____________________________________________  RADIOLOGY  MRI brain normal ____________________________________________   PROCEDURES  Procedure(s) performed: No  Procedures   Critical Care performed: No ____________________________________________   INITIAL IMPRESSION / ASSESSMENT AND PLAN / ED COURSE  Pertinent labs & imaging results that were available during my care of the patient were reviewed by me and considered in my medical decision making (see chart for details).  Patient presents with essentially diffuse paresthesias.  Differential includes electrolyte abnormality, nerve conduction disorder, MS or other neuropathy  Lab work today is quite reassuring and unremarkable.  We will treat with IV Decadron  Discussed with patient that outpatient neurology work-up will be the best way to evaluate this thoroughly however we will obtain MRI brain here as I will likely be necessary.  MRI brain is normal.  Appropriate for discharge at this time    ____________________________________________   FINAL CLINICAL IMPRESSION(S) / ED DIAGNOSES  Final diagnoses:  Paresthesias        Note:  This document was prepared using Dragon voice recognition software and may include unintentional dictation errors.   Jene Every, MD 06/28/20 2045

## 2020-06-28 NOTE — ED Triage Notes (Addendum)
Reports tingling and "zinging" sensation to entire body including all extremities. Pt states she is able walk but it is difficult due to the sensation. Pt seen at [facility] and had BP 98/61 and sent to ED. Pt has a neurology appt at end of month. Pt reports these symptoms have been occurring for a few weeks.   Pt alert and oriented X4, cooperative, RR even and unlabored, color WNL. Pt in NAD.  Visualized walking without any obvious difficulty and safely to bathroom for urine sample.

## 2020-06-28 NOTE — ED Notes (Signed)
Pt resting in bed with mother at bedside at this time

## 2020-06-28 NOTE — ED Notes (Signed)
Pt to MRI

## 2020-06-28 NOTE — ED Notes (Signed)
Pt on phone with MRI for screening questions  

## 2020-06-28 NOTE — ED Notes (Signed)
This nurse contacted MRI to see time for pt to go have MRI completed. MRI states that pt should be next

## 2020-07-27 ENCOUNTER — Other Ambulatory Visit: Payer: Self-pay

## 2020-07-27 ENCOUNTER — Encounter: Payer: Self-pay | Admitting: Obstetrics and Gynecology

## 2020-07-27 ENCOUNTER — Other Ambulatory Visit (HOSPITAL_COMMUNITY)
Admission: RE | Admit: 2020-07-27 | Discharge: 2020-07-27 | Disposition: A | Discharge status: Home / Self Care | Payer: BC Managed Care – PPO | Source: Ambulatory Visit | Attending: Obstetrics and Gynecology | Admitting: Obstetrics and Gynecology

## 2020-07-27 ENCOUNTER — Ambulatory Visit (INDEPENDENT_AMBULATORY_CARE_PROVIDER_SITE_OTHER): Payer: BC Managed Care – PPO | Admitting: Obstetrics and Gynecology

## 2020-07-27 VITALS — BP 122/74 | Ht 64.0 in | Wt 124.0 lb

## 2020-07-27 DIAGNOSIS — Z01419 Encounter for gynecological examination (general) (routine) without abnormal findings: Secondary | ICD-10-CM | POA: Insufficient documentation

## 2020-07-27 DIAGNOSIS — Z113 Encounter for screening for infections with a predominantly sexual mode of transmission: Secondary | ICD-10-CM | POA: Diagnosis present

## 2020-07-27 DIAGNOSIS — Z124 Encounter for screening for malignant neoplasm of cervix: Secondary | ICD-10-CM | POA: Insufficient documentation

## 2020-07-27 DIAGNOSIS — N921 Excessive and frequent menstruation with irregular cycle: Secondary | ICD-10-CM

## 2020-07-27 DIAGNOSIS — Z1339 Encounter for screening examination for other mental health and behavioral disorders: Secondary | ICD-10-CM | POA: Diagnosis not present

## 2020-07-27 DIAGNOSIS — Z1331 Encounter for screening for depression: Secondary | ICD-10-CM

## 2020-07-27 NOTE — Progress Notes (Signed)
Gynecology Annual Exam   PCP: Kandyce Rud, MD  Chief Complaint  Patient presents with  . Annual Exam   History of Present Illness:  Ms. Jessica Everett is a 24 y.o. G0P0000 who LMP was Patient's last menstrual period was 06/27/2020., presents today for her annual examination.  Her menses are still irregular and her periods continue to be nearly constant. She states that she is in week 2 of the new pack. Last week she only had three days of no bleeding and then started back bleeding again.  She continues to take Sprintec to try to control her bleeding.  We have previously had multiple conversations regarding treatments and multiple options.  She in no certain terms emphatically states that she does not desire to retain fertility and that she has had ongoing issues with her abnormal menses.  She greatly desires a hysterectomy.   She is not sexually active at the moment..  Last Pap: 06/29/2018  Results were: no abnormalities /neg HPV DNA negative. Hx of STDs: none  There is no FH of breast cancer. There is no FH of ovarian cancer. The patient does not do self-breast exams.  Tobacco use: The patient denies current or previous tobacco use. Alcohol use: social drinker Exercise: riding horses.    The patient wears seatbelts: yes.   The patient reports that domestic violence in her life is absent.   Past Medical History:  Diagnosis Date  . Asthma    as a child  . Scoliosis     Past Surgical History:  Procedure Laterality Date  . BACK SURGERY    . WRIST SURGERY      Prior to Admission medications   Medication Sig Start Date End Date Taking? Authorizing Provider  ADDERALL XR 15 MG 24 hr capsule TK 1 C PO ONCE D 05/13/19  Yes [provider]  norgestimate-ethinyl estradiol (SPRINTEC 28) 0.25-35 MG-MCG tablet Take 1 tablet by mouth daily. 12/28/19 03/21/20  Conard Novak, MD    Allergies  Allergen Reactions  . Penicillins   . Tessalon [Benzonatate] Other (See Comments)     Tingling and numbness in legs   Obstetric History: G0P0000  Social History   Socioeconomic History  . Marital status: Single    Spouse name: Not on file  . Number of children: Not on file  . Years of education: Not on file  . Highest education level: Not on file  Occupational History  . Not on file  Tobacco Use  . Smoking status: Never Smoker  . Smokeless tobacco: Never Used  Vaping Use  . Vaping Use: Never used  Substance and Sexual Activity  . Alcohol use: Yes  . Drug use: No  . Sexual activity: Not Currently    Birth control/protection: Pill  Other Topics Concern  . Not on file  Social History Narrative  . Not on file   Social Determinants of Health   Financial Resource Strain:   . Difficulty of Paying Living Expenses:   Food Insecurity:   . Worried About Programme researcher, broadcasting/film/video in the Last Year:   . Barista in the Last Year:   Transportation Needs:   . Freight forwarder (Medical):   Marland Kitchen Lack of Transportation (Non-Medical):   Physical Activity:   . Days of Exercise per Week:   . Minutes of Exercise per Session:   Stress:   . Feeling of Stress :   Social Connections:   . Frequency of Communication with Friends and  Family:   . Frequency of Social Gatherings with Friends and Family:   . Attends Religious Services:   . Active Member of Clubs or Organizations:   . Attends Banker Meetings:   Marland Kitchen Marital Status:   Intimate Partner Violence:   . Fear of Current or Ex-Partner:   . Emotionally Abused:   Marland Kitchen Physically Abused:   . Sexually Abused:    Family History  Problem Relation Age of Onset  . Cancer Maternal Grandfather   . Heart disease Maternal Grandfather   . Stroke Maternal Grandfather     Review of Systems  Constitutional: Negative.   HENT: Negative.   Eyes: Negative.   Respiratory: Negative.   Cardiovascular: Negative.   Gastrointestinal: Negative.   Genitourinary: Negative.        Frequent vaginal bleeding    Musculoskeletal: Negative.   Skin: Negative.   Neurological: Positive for tingling and sensory change. Negative for dizziness, tremors, speech change, focal weakness, seizures, loss of consciousness, weakness and headaches.  Psychiatric/Behavioral: Negative.      Physical Exam BP 122/74   Ht 5\' 4"  (1.626 m)   Wt 124 lb (56.2 kg)   LMP 06/27/2020   BMI 21.28 kg/m    Physical Exam Constitutional:      General: She is not in acute distress.    Appearance: Normal appearance. She is well-developed.  Genitourinary:     Pelvic exam was performed with patient in the lithotomy position.     Vulva, urethra, bladder and uterus normal.     No inguinal adenopathy present in the right or left side.    No signs of injury in the vagina.     No vaginal discharge, erythema, tenderness or bleeding.     Cervical motion tenderness present.     No cervical discharge, lesion or polyp.     Uterus is mobile.     Uterus is not enlarged or tender.     No uterine mass detected.    Uterus is anteverted.     No right or left adnexal mass present.     Right adnexa not tender or full.     Left adnexa not tender or full.  HENT:     Head: Normocephalic and atraumatic.  Eyes:     General: No scleral icterus.    Conjunctiva/sclera: Conjunctivae normal.  Neck:     Thyroid: No thyromegaly.  Cardiovascular:     Rate and Rhythm: Normal rate and regular rhythm.     Heart sounds: No murmur heard.  No friction rub. No gallop.   Pulmonary:     Effort: Pulmonary effort is normal. No respiratory distress.     Breath sounds: Normal breath sounds. No wheezing or rales.  Chest:     Breasts:        Right: No inverted nipple, mass, nipple discharge, skin change or tenderness.        Left: No inverted nipple, mass, nipple discharge, skin change or tenderness.  Abdominal:     General: Bowel sounds are normal. There is no distension.     Palpations: Abdomen is soft. There is no mass.     Tenderness: There is no  abdominal tenderness. There is no guarding or rebound.  Musculoskeletal:        General: No swelling or tenderness. Normal range of motion.     Cervical back: Normal range of motion and neck supple.  Lymphadenopathy:     Cervical: No cervical adenopathy.  Lower Body: No right inguinal adenopathy. No left inguinal adenopathy.  Neurological:     General: No focal deficit present.     Mental Status: She is alert and oriented to person, place, and time.     Cranial Nerves: No cranial nerve deficit.  Skin:    General: Skin is warm and dry.     Findings: No erythema or rash.  Psychiatric:        Mood and Affect: Mood normal.        Behavior: Behavior normal.        Judgment: Judgment normal.     Female chaperone present for pelvic and breast  portions of the physical exam  Results: AUDIT Questionnaire (screen for alcoholism): 2 PHQ-9: 1   Assessment: 24 y.o. G0P0000 female here for routine annual gynecologic examination  Plan: Problem List Items Addressed This Visit    None    Visit Diagnoses    Women's annual routine gynecological examination    -  Primary   Relevant Orders   Cytology - PAP   Screening for depression       Screening for alcoholism       Pap smear for cervical cancer screening       Relevant Orders   Cytology - PAP   Screen for STD (sexually transmitted disease)       Relevant Orders   Cytology - PAP   Menorrhagia with irregular cycle          Screening: -- Blood pressure screen normal -- Weight screening: normal -- Depression screening negative (PHQ-9) -- Nutrition: normal -- cholesterol screening: not due for screening -- osteoporosis screening: not due -- tobacco screening: not using -- alcohol screening: AUDIT questionnaire indicates low-risk usage. -- family history of breast cancer screening: done. not at high risk. -- no evidence of domestic violence or intimate partner violence. -- STD screening: gonorrhea/chlamydia NAAT collected --  pap smear collected per ASCCP guidelines  Menorrhagia with irregular cycle: Based on multiple failed treatments in the past, no obvious etiology, and patient's strong desire, the patient would like a hysterectomy. I have counseled her about regret and changes in life circumstances.  She voices an understanding of these risks and a willingness to bear them.  Will schedule TLH/BS/Cysto.   Thomasene Mohair, MD 07/27/2020 10:36 AM

## 2020-07-30 LAB — CYTOLOGY - PAP
Chlamydia: POSITIVE — AB
Comment: NEGATIVE
Comment: NEGATIVE
Comment: NORMAL
Diagnosis: NEGATIVE
Neisseria Gonorrhea: NEGATIVE
Trichomonas: NEGATIVE

## 2020-07-31 NOTE — Progress Notes (Signed)
Would you mind calling this patient and letting her know the STD that was found? This may help with her bleeding. Let me know when she knows and I'll send in the prescription. Also, her partner will need to be treated, if she has one.

## 2020-08-01 ENCOUNTER — Other Ambulatory Visit: Payer: Self-pay | Admitting: Obstetrics and Gynecology

## 2020-08-01 ENCOUNTER — Telehealth: Payer: Self-pay

## 2020-08-01 DIAGNOSIS — A5609 Other chlamydial infection of lower genitourinary tract: Secondary | ICD-10-CM

## 2020-08-01 MED ORDER — AZITHROMYCIN 500 MG PO TABS: 1000.0000 mg | ORAL_TABLET | Freq: Once | ORAL | 0 refills | Status: AC

## 2020-08-01 NOTE — Telephone Encounter (Signed)
Please advise 

## 2020-08-01 NOTE — Telephone Encounter (Signed)
Pt called triage today in reference to abnormal lab result. Pt would like to know when she needs to come back for a follow up lab?

## 2020-08-02 NOTE — Telephone Encounter (Signed)
Patient is scheduled for 09/05/20

## 2020-08-02 NOTE — Telephone Encounter (Signed)
Is she talking about the STD?  If so, no follow up is required. If she wants to be tested, we can do it in one month.

## 2020-08-02 NOTE — Telephone Encounter (Signed)
Pt aware. Transferred to Cleda Daub for scheduling.

## 2020-08-02 NOTE — Telephone Encounter (Signed)
Can you please call this pt and let her know since I am in this [facility] web ex. Thank you

## 2020-08-06 ENCOUNTER — Telehealth: Payer: Self-pay | Admitting: Obstetrics and Gynecology

## 2020-08-06 NOTE — Telephone Encounter (Signed)
Called pt to schedule surgery with Jean Rosenthal. She requested first week in September and I adv that I do have 9/2 available. She said she would call back to confirm as she was not at home and would need to check her schedule. I will wait to schedule until I hear back from pt.

## 2020-08-06 NOTE — Telephone Encounter (Signed)
Called patient to schedule TLH/BS, Cystoscopy  DOS 9/2  H&P 8/26 @ 4:30 [place]   Covid testing 8/31 @ 8-10:30, [address], drive up and wear mask. Advised pt to quarantine until DOS.  Pre-admit phone call appointment to be requested - date and time will be included on H&P paper work. Also all appointments will be updated on pt MyChart. Explained that this appointment has a call window. Based on the time scheduled will indicate if the call will be received within a 4 hour window before 1:00 or after.  Advised that pt may also receive calls from the hospital pharmacy and pre-service center.  Confirmed pt has [employer] as Editor, commissioning. No secondary insurance.

## 2020-08-06 NOTE — Telephone Encounter (Signed)
-----   Message from Conard Novak, MD sent at 07/27/2020 10:39 AM EDT ----- Regarding: Schedule surgery Surgery Booking Request Patient Full Name:  Jessica Everett  MRN: [id number]  DOB: 19-Oct-1996  Surgeon: Thomasene Mohair, MD  Requested Surgery Date and Time: TBD Primary Diagnosis AND Code: Menorrhagia with irregular cycle [N92.1] Secondary Diagnosis and Code:  Surgical Procedure: Total laparoscopic hysterectomy, bilateral salpingectomy, cystoscopy RNFA Requested?: No L&D Notification: No Admission Status: same day surgery Length of Surgery: 125 min Special Case Needs: No H&P: Yes Phone Interview???:  Yes Interpreter: No Medical Clearance:  No Special Scheduling Instructions: No Any known health/anesthesia issues, diabetes, sleep apnea, latex allergy, defibrillator/pacemaker?: No Acuity: P3   (P1 highest, P2 delay may cause harm, P3 low, elective gyn, P4 lowest)

## 2020-08-09 ENCOUNTER — Other Ambulatory Visit: Payer: Self-pay

## 2020-08-09 ENCOUNTER — Encounter: Payer: Self-pay | Admitting: Obstetrics and Gynecology

## 2020-08-09 ENCOUNTER — Ambulatory Visit (INDEPENDENT_AMBULATORY_CARE_PROVIDER_SITE_OTHER): Payer: BC Managed Care – PPO | Admitting: Obstetrics and Gynecology

## 2020-08-09 ENCOUNTER — Other Ambulatory Visit (HOSPITAL_COMMUNITY)
Admission: RE | Admit: 2020-08-09 | Discharge: 2020-08-09 | Disposition: A | Discharge status: Home / Self Care | Payer: BC Managed Care – PPO | Source: Ambulatory Visit | Attending: Obstetrics and Gynecology | Admitting: Obstetrics and Gynecology

## 2020-08-09 VITALS — BP 128/82 | HR 92 | Ht 64.0 in | Wt 122.0 lb

## 2020-08-09 DIAGNOSIS — N921 Excessive and frequent menstruation with irregular cycle: Secondary | ICD-10-CM | POA: Insufficient documentation

## 2020-08-09 DIAGNOSIS — A5609 Other chlamydial infection of lower genitourinary tract: Secondary | ICD-10-CM

## 2020-08-09 NOTE — Progress Notes (Signed)
Preoperative History and Physical  Chief Complaint Jessica Everett is a 24 y.o. G0P0000 here for surgical management of menorrhagia with irregular cycle .   No significant preoperative concerns.  History of Present Illness: Her menses are still irregular and her periods continue to be nearly constant. She states that she is in week 2 of the new pack. She will have only had three days of no bleeding and then start bleeding again.  She continues to take Sprintec to try to control her bleeding.  We have previously had multiple conversations regarding treatments and multiple options.  She in no certain terms emphatically states that she does not desire to retain fertility and that she has had ongoing issues with her abnormal menses.  She greatly desires a hysterectomy.  We have had multiple conversations regarding the permanent nature of a hysterectomy and the fact that it will render her unable to have a child. She states that she has never wanted to have a child, nor does ever plan on having one.  Proposed surgery: Total laparoscopic hysterectomy, bilateral salpingectomy, cystoscopy  Past Medical History:  Diagnosis Date   Asthma    as a child   Scoliosis    Past Surgical History:  Procedure Laterality Date   BACK SURGERY     WRIST SURGERY     OB History  Gravida Para Term Preterm AB Living  0 0 0 0 0 0  SAB TAB Ectopic Multiple Live Births  0 0 0 0 0  Patient denies any other pertinent gynecologic issues.   Current Outpatient Medications on File Prior to Visit  Medication Sig Dispense Refill   ADDERALL XR 15 MG 24 hr capsule Take 15 mg by mouth every other day.      gabapentin (NEURONTIN) 300 MG capsule Take 300 mg by mouth 3 (three) times daily.     vitamin B-12 (CYANOCOBALAMIN) 1000 MCG tablet Take 1,000 mcg by mouth daily.     norgestimate-ethinyl estradiol (SPRINTEC 28) 0.25-35 MG-MCG tablet Take 1 tablet by mouth daily. 84 tablet 3   No current facility-administered  medications on file prior to visit.   Allergies  Allergen Reactions   Penicillins Swelling   Tessalon [Benzonatate] Other (See Comments)    Tingling and numbness in legs    Social History:   reports that she has never smoked. She has never used smokeless tobacco. She reports current alcohol use. She reports that she does not use drugs.  Family History  Problem Relation Age of Onset   Cancer Maternal Grandfather    Heart disease Maternal Grandfather    Stroke Maternal Grandfather     Review of Systems: Noncontributory  PHYSICAL EXAM: Blood pressure 128/82, pulse 92, height 5\' 4"  (1.626 m), weight 122 lb (55.3 kg). CONSTITUTIONAL: Well-developed, well-nourished female in no acute distress.  HENT:  Normocephalic, atraumatic, External right and left ear normal. Oropharynx is clear and moist EYES: Conjunctivae and EOM are normal. Pupils are equal, round, and reactive to light. No scleral icterus.  NECK: Normal range of motion, supple, no masses SKIN: Skin is warm and dry. No rash noted. Not diaphoretic. No erythema. No pallor. NEUROLGIC: Alert and oriented to person, place, and time. Normal reflexes, muscle tone coordination. No cranial nerve deficit noted. PSYCHIATRIC: Normal mood and affect. Normal behavior. Normal judgment and thought content. CARDIOVASCULAR: Normal heart rate noted, regular rhythm RESPIRATORY: Effort and breath sounds normal, no problems with respiration noted ABDOMEN: Soft, nontender, nondistended. PELVIC: Deferred MUSCULOSKELETAL: Normal range of motion. No  edema and no tenderness. 2+ distal pulses.  Labs: Results for orders placed or performed in visit on 07/27/20 (from the past 336 hour(s))  Cytology - PAP   Collection Time: 07/27/20  9:17 AM  Result Value Ref Range   Neisseria Gonorrhea Negative    Chlamydia Positive (A)    Trichomonas Negative    Adequacy      Satisfactory for evaluation; transformation zone component PRESENT.   Diagnosis       - Negative for intraepithelial lesion or malignancy (NILM)   Comment Normal Reference Range Trichomonas - Negative    Comment Normal Reference Ranger Chlamydia - Negative    Comment      Normal Reference Range Neisseria Gonorrhea - Negative    Imaging Studies: No results found.  Assessment:   ICD-10-CM   1. Menorrhagia with irregular cycle  N92.1     Plan: Patient will undergo surgical management with the above-noted surgery.   The risks of surgery were discussed in detail with the patient including but not limited to: bleeding which may require transfusion or reoperation; infection which may require antibiotics; injury to surrounding organs which may involve bowel, bladder, ureters ; need for additional procedures including laparoscopy or laparotomy; thromboembolic phenomenon, surgical site problems and other postoperative/anesthesia complications. Likelihood of success in alleviating the patient's condition was discussed. Routine postoperative instructions will be reviewed with the patient and her family in detail after surgery.  The patient concurred with the proposed plan, giving informed written consent for the surgery.  Preoperative prophylactic antibiotics, as indicated, and SCDs ordered on call to the OR.    Thomasene Mohair, MD 08/09/2020 5:02 PM

## 2020-08-09 NOTE — H&P (View-Only) (Signed)
Preoperative History and Physical ° °Chief Complaint °Jessica Everett is a 24 y.o. G0P0000 here for surgical management of menorrhagia with irregular cycle .   No significant preoperative concerns. ° °History of Present Illness: Her menses are still irregular and her periods continue to be nearly constant. She states that she is in week 2 of the new pack. She will have only had three days of no bleeding and then start bleeding again.  She continues to take Sprintec to try to control her bleeding.  We have previously had multiple conversations regarding treatments and multiple options.  She in no certain terms emphatically states that she does not desire to retain fertility and that she has had ongoing issues with her abnormal menses.  She greatly desires a hysterectomy.  We have had multiple conversations regarding the permanent nature of a hysterectomy and the fact that it will render her unable to have a child. She states that she has never wanted to have a child, nor does ever plan on having one. ° °Proposed surgery: Total laparoscopic hysterectomy, bilateral salpingectomy, cystoscopy ° °Past Medical History:  °Diagnosis Date  °• Asthma   ° as a child  °• Scoliosis   ° °Past Surgical History:  °Procedure Laterality Date  °• BACK SURGERY    °• WRIST SURGERY    ° °OB History  °Gravida Para Term Preterm AB Living  °0 0 0 0 0 0  °SAB TAB Ectopic Multiple Live Births  °0 0 0 0 0  °Patient denies any other pertinent gynecologic issues.  ° °Current Outpatient Medications on File Prior to Visit  °Medication Sig Dispense Refill  °• ADDERALL XR 15 MG 24 hr capsule Take 15 mg by mouth every other day.     °• gabapentin (NEURONTIN) 300 MG capsule Take 300 mg by mouth 3 (three) times daily.    °• vitamin B-12 (CYANOCOBALAMIN) 1000 MCG tablet Take 1,000 mcg by mouth daily.    °• norgestimate-ethinyl estradiol (SPRINTEC 28) 0.25-35 MG-MCG tablet Take 1 tablet by mouth daily. 84 tablet 3  ° °No current facility-administered  medications on file prior to visit.  ° °Allergies  °Allergen Reactions  °• Penicillins Swelling  °• Tessalon [Benzonatate] Other (See Comments)  °  Tingling and numbness in legs  ° ° °Social History:   reports that she has never smoked. She has never used smokeless tobacco. She reports current alcohol use. She reports that she does not use drugs. ° °Family History  °Problem Relation Age of Onset  °• Cancer Maternal Grandfather   °• Heart disease Maternal Grandfather   °• Stroke Maternal Grandfather   ° ° °Review of Systems: Noncontributory ° °PHYSICAL EXAM: °Blood pressure 128/82, pulse 92, height 5' 4" (1.626 m), weight 122 lb (55.3 kg). °CONSTITUTIONAL: Well-developed, well-nourished female in no acute distress.  °HENT:  Normocephalic, atraumatic, External right and left ear normal. Oropharynx is clear and moist °EYES: Conjunctivae and EOM are normal. Pupils are equal, round, and reactive to light. No scleral icterus.  °NECK: Normal range of motion, supple, no masses °SKIN: Skin is warm and dry. No rash noted. Not diaphoretic. No erythema. No pallor. °NEUROLGIC: Alert and oriented to person, place, and time. Normal reflexes, muscle tone coordination. No cranial nerve deficit noted. °PSYCHIATRIC: Normal mood and affect. Normal behavior. Normal judgment and thought content. °CARDIOVASCULAR: Normal heart rate noted, regular rhythm °RESPIRATORY: Effort and breath sounds normal, no problems with respiration noted °ABDOMEN: Soft, nontender, nondistended. °PELVIC: Deferred °MUSCULOSKELETAL: Normal range of motion. No   edema and no tenderness. 2+ distal pulses. ° °Labs: °Results for orders placed or performed in visit on 07/27/20 (from the past 336 hour(s))  °Cytology - PAP  ° Collection Time: 07/27/20  9:17 AM  °Result Value Ref Range  ° Neisseria Gonorrhea Negative   ° Chlamydia Positive (A)   ° Trichomonas Negative   ° Adequacy    °  Satisfactory for evaluation; transformation zone component PRESENT.  ° Diagnosis    °   - Negative for intraepithelial lesion or malignancy (NILM)  ° Comment Normal Reference Range Trichomonas - Negative   ° Comment Normal Reference Ranger Chlamydia - Negative   ° Comment    °  Normal Reference Range Neisseria Gonorrhea - Negative  ° ° °Imaging Studies: °No results found. ° °Assessment: °  ICD-10-CM   °1. Menorrhagia with irregular cycle  N92.1   °  °Plan: °Patient will undergo surgical management with the above-noted surgery.   The risks of surgery were discussed in detail with the patient including but not limited to: bleeding which may require transfusion or reoperation; infection which may require antibiotics; injury to surrounding organs which may involve bowel, bladder, ureters ; need for additional procedures including laparoscopy or laparotomy; thromboembolic phenomenon, surgical site problems and other postoperative/anesthesia complications. Likelihood of success in alleviating the patient's condition was discussed. Routine postoperative instructions will be reviewed with the patient and her family in detail after surgery.  The patient concurred with the proposed plan, giving informed written consent for the surgery.  Preoperative prophylactic antibiotics, as indicated, and SCDs ordered on call to the OR.   ° °Kashmere Staffa, MD °08/09/2020 5:02 PM   ° °

## 2020-08-10 ENCOUNTER — Other Ambulatory Visit
Admission: RE | Admit: 2020-08-10 | Discharge: 2020-08-10 | Disposition: A | Discharge status: Home / Self Care | Payer: BC Managed Care – PPO | Source: Ambulatory Visit | Attending: Obstetrics and Gynecology | Admitting: Obstetrics and Gynecology

## 2020-08-10 NOTE — Patient Instructions (Signed)
Your procedure is scheduled on:  Thursday 08/16/20.  Report to DAY SURGERY DEPARTMENT LOCATED ON 2ND FLOOR MEDICAL MALL ENTRANCE. To find out your arrival time please call [phone] between 1PM - 3PM on  Wednesday 08/15/20.   Remember: Instructions that are not followed completely may result in serious medical risk, up to and including death, or upon the discretion of your surgeon and anesthesiologist your surgery may need to be rescheduled.     __X__ 1. Do not eat food after midnight the night before your procedure.                 No gum chewing or hard candies. You may drink clear liquids up to 2 hours                 before you are scheduled to arrive for your surgery- DO NOT drink clear                 liquids within 2 hours of the start of your surgery.                 Clear Liquids include:  water, apple juice without pulp, clear carbohydrate                 drink such as Clearfast or Gatorade, Black Coffee or Tea (Do not add                 milk or creamer to coffee or tea).  ** Dr. Jean Rosenthal would like for you to finish the Clear Pre-Surgery Ensure 2 hours prior to your arrival on the morning of surgery. **  __X__2.  On the morning of surgery brush your teeth with toothpaste and water, you may rinse your mouth with mouthwash if you wish.  Do not swallow any toothpaste or mouthwash.    __X__ 3.  No Alcohol for 24 hours before or after surgery.  __X__ 4.  Do Not Smoke or use e-cigarettes For 24 Hours Prior to Your Surgery.                 Do not use any chewable tobacco products for at least 6 hours prior to                 surgery.  __X__5.  Notify your doctor if there is any change in your medical condition      (cold, fever, infections).      Do NOT wear jewelry, make-up, hairpins, clips or nail polish. Do NOT wear lotions, powders, or perfumes.  Do NOT shave 48 hours prior to surgery. Men may shave face and neck. Do NOT bring valuables to the hospital.     [facility]  is not responsible for any belongings or valuables.   Contacts, dentures/partials or body piercings may not be worn into surgery. Bring a case for your contacts, glasses or hearing aids, a denture cup will be supplied. Leave your suitcase in the car. After surgery it may be brought to your room.   For patients admitted to the hospital, discharge time is determined by your treatment team.    Patients discharged the day of surgery will not be allowed to drive home.     __X__ Take these medicines the morning of surgery with A SIP OF WATER:     1. gabapentin (NEURONTIN)      __X__ Use CHG Soap as directed  __X__ Stop Anti-inflammatories 7 days before surgery such as Advil, Ibuprofen,  Motrin, BC or Goodies Powder, Naprosyn, Naproxen, Aleve, Aspirin, Meloxicam. May take Tylenol if needed for pain or discomfort.   __X__Do not start taking any new herbal supplements or vitamins prior to your procedure.     Wear comfortable clothing (specific to your surgery type) to the hospital.  Plan for stool softeners for home use; pain medications have a tendency to cause constipation. You can also help prevent constipation by eating foods high in fiber such as fruits and vegetables and drinking plenty of fluids as your diet allows.  After surgery, you can prevent lung complications by doing breathing exercises.Take deep breaths and cough every 1-2 hours. Your doctor may order a device called an Incentive Spirometer to help you take deep breaths.  Please call the Pre-Admissions Testing Department at [phone] if you have any questions about these instructions.

## 2020-08-13 LAB — CERVICOVAGINAL ANCILLARY ONLY
Chlamydia: NEGATIVE
Comment: NEGATIVE
Comment: NORMAL
Neisseria Gonorrhea: NEGATIVE

## 2020-08-14 ENCOUNTER — Encounter: Payer: Self-pay | Admitting: Urgent Care

## 2020-08-14 ENCOUNTER — Other Ambulatory Visit: Payer: Self-pay

## 2020-08-14 ENCOUNTER — Other Ambulatory Visit
Admission: RE | Admit: 2020-08-14 | Discharge: 2020-08-14 | Disposition: A | Discharge status: Home / Self Care | Payer: BC Managed Care – PPO | Source: Ambulatory Visit | Attending: Obstetrics and Gynecology | Admitting: Obstetrics and Gynecology

## 2020-08-14 DIAGNOSIS — Z20822 Contact with and (suspected) exposure to covid-19: Secondary | ICD-10-CM | POA: Diagnosis not present

## 2020-08-14 DIAGNOSIS — Z01812 Encounter for preprocedural laboratory examination: Secondary | ICD-10-CM | POA: Insufficient documentation

## 2020-08-14 DIAGNOSIS — N921 Excessive and frequent menstruation with irregular cycle: Secondary | ICD-10-CM | POA: Diagnosis not present

## 2020-08-14 DIAGNOSIS — Z79899 Other long term (current) drug therapy: Secondary | ICD-10-CM | POA: Diagnosis not present

## 2020-08-14 LAB — COMPREHENSIVE METABOLIC PANEL
ALT: 32 U/L (ref 0–44)
AST: 31 U/L (ref 15–41)
Albumin: 4.3 g/dL (ref 3.5–5.0)
Alkaline Phosphatase: 64 U/L (ref 38–126)
Anion gap: 10 (ref 5–15)
BUN: 13 mg/dL (ref 6–20)
CO2: 25 mmol/L (ref 22–32)
Calcium: 9.1 mg/dL (ref 8.9–10.3)
Chloride: 103 mmol/L (ref 98–111)
Creatinine, Ser: 0.67 mg/dL (ref 0.44–1.00)
GFR calc Af Amer: >60 mL/min (ref >60)
GFR calc non Af Amer: >60 mL/min (ref >60)
Glucose, Bld: 85 mg/dL (ref 70–99)
Potassium: 4.5 mmol/L (ref 3.5–5.1)
Sodium: 138 mmol/L (ref 135–145)
Total Bilirubin: 1 mg/dL (ref 0.3–1.2)
Total Protein: 7.3 g/dL (ref 6.5–8.1)

## 2020-08-14 LAB — SARS CORONAVIRUS 2 (TAT 6-24 HRS): SARS Coronavirus 2: NEGATIVE

## 2020-08-14 LAB — TYPE AND SCREEN
ABO/RH(D): A POS
Antibody Screen: NEGATIVE

## 2020-08-14 NOTE — Progress Notes (Signed)
  [facility] Perioperative Services: Pre-Admission/Anesthesia Testing   Date: 08/14/20  Name: Jessica Everett MRN:   [id number]  Re: Consideration of perioperative therapeutic ABX change in patient with PCN allergy  Request sent to: Conard Novak, MD Notification mode: Routed and/or faxed via [facility]   This patient is scheduled for a total laparoscopic hysterectomy with BILATERAL salpingectomy coming up on 08/16/2020. In review of their medical records it appears as if she has a documented PCN allergy. Her reaction to PCN is swelling; undocumented severity. She currently has clindamycin and gentamycin ordered. As an evidence based approach to reducing the rate of incidence for post-operative SSI and the development of MDROs, could an agent with narrower coverage for preoperative prophylaxis in this patient's upcoming surgical course be considered? Specifically requesting change to a first generation cephalosporin (cefazolin). Please have your staff communicate decision with me. I would be happy to change the orders in Epic as per your directives.   Things to consider: . Many patients report that they were "allergic" to PCN earlier in life, however this does not translate into a true lifelong allergy. Patient's can lose sensitivity to specific IgE antibodies over time if PCN is avoided (Kleris & Lugar, 2019).  Marland Kitchen Up to 10% of the adult population and 15% of hospitalized patients report an allergy to PCN, however clinical studies suggest that 90% of those reporting an allergy can tolerate PCN antibiotics (Kleris & Lugar, 2019).  . Cross-sensitivity between PCN and cephalosporins has been documented as being as high as 10%, however this estimation included data believed to have been collected in a setting where there was contamination. Newer data suggests that the prevalence of cross-sensitivity between PCN and cephalosporins is actually estimated to be closer to 1% (Hermanides et  al., 2018).   . Patients labeled as PCN allergic, whether they are truly allergic or not, have been found to have inferior outcomes in terms of rates of serious infection, and these patients tend to have longer hospital stays [facility] & Lugar, 2019).  . Treatment related secondary infections, such as Clostridioides difficile, have been linked to the improper use of broad spectrum antibiotics in patients improperly labeled as PCN allergic (Kleris & Lugar, 2019).  Marland Kitchen Anaphylaxis from cephalosporins is rare and the evidence suggests that there is no increased risk of an anaphylactic type reaction when cephalosporins are used in a PCN allergic patient (Pichichero, 2006).   Citations Hermanides J, Lemkes BA, Prins JM, Hollmann MW, Terreehorst I. Presumed ?-Lactam Allergy and Cross-reactivity in the Operating Theater: A Practical Approach. Anesthesiology. 2018 Aug;129(2):335-342. doi: 10.1097/ALN.0000000000002252. PMID: 25956387.  Kleris, R. S., & Lugar, P. L. (2019). Things We Do For No Reason: Failing to Question a Penicillin Allergy History. Journal of hospital medicine, 14(10), (905) 516-2034. Advance online publication. airportbarriers.com  Pichichero, M. E. (2006). Cephalosporins can be prescribed safely for penicillin-allergic patients. Journal of family medicine, 55(2), 106-112. Accessed: https://cdn.mdedge.com/files/s85fs-public/Document/September-2017/5502JFP_AppliedEvidence1.pdf  Quentin Mulling, MSN, APRN, FNP-C, CEN [facility]  Peri-operative Services Nurse Practitioner Phone: [phone] 08/14/20 10:32 AM

## 2020-08-15 MED ORDER — GENTAMICIN SULFATE 40 MG/ML IJ SOLN
5.0000 mg/kg | INTRAVENOUS | Status: AC
  Administered 2020-08-16: 281.2 mg via INTRAVENOUS
  Filled 2020-08-15: qty 7

## 2020-08-15 MED ORDER — CLINDAMYCIN PHOSPHATE 900 MG/50ML IV SOLN
900.0000 mg | INTRAVENOUS | Status: AC
  Administered 2020-08-16: 900 mg via INTRAVENOUS

## 2020-08-16 ENCOUNTER — Other Ambulatory Visit: Payer: Self-pay

## 2020-08-16 ENCOUNTER — Ambulatory Visit
Admission: RE | Admit: 2020-08-16 | Discharge: 2020-08-16 | Disposition: A | Discharge status: Home / Self Care | Payer: BC Managed Care – PPO | Attending: Obstetrics and Gynecology | Admitting: Obstetrics and Gynecology

## 2020-08-16 ENCOUNTER — Encounter
Admission: RE | Disposition: A | Discharge status: Home / Self Care | Payer: Self-pay | Source: Home / Self Care | Attending: Obstetrics and Gynecology

## 2020-08-16 ENCOUNTER — Ambulatory Visit: Payer: BC Managed Care – PPO

## 2020-08-16 ENCOUNTER — Encounter: Payer: Self-pay | Admitting: Obstetrics and Gynecology

## 2020-08-16 DIAGNOSIS — Z90710 Acquired absence of both cervix and uterus: Secondary | ICD-10-CM

## 2020-08-16 DIAGNOSIS — Z79899 Other long term (current) drug therapy: Secondary | ICD-10-CM | POA: Insufficient documentation

## 2020-08-16 DIAGNOSIS — N921 Excessive and frequent menstruation with irregular cycle: Secondary | ICD-10-CM | POA: Insufficient documentation

## 2020-08-16 HISTORY — PX: CYSTOSCOPY: SHX5120

## 2020-08-16 HISTORY — PX: TOTAL LAPAROSCOPIC HYSTERECTOMY WITH SALPINGECTOMY: SHX6742

## 2020-08-16 LAB — ABO/RH: ABO/RH(D): A POS

## 2020-08-16 LAB — CBC
HCT: 39.3 % (ref 36.0–46.0)
Hemoglobin: 12.4 g/dL (ref 12.0–15.0)
MCH: 28.8 pg (ref 26.0–34.0)
MCHC: 31.6 g/dL (ref 30.0–36.0)
MCV: 91.4 fL (ref 80.0–100.0)
Platelets: 144 10*3/uL — ABNORMAL LOW (ref 150–400)
RBC: 4.3 MIL/uL (ref 3.87–5.11)
RDW: 15 % (ref 11.5–15.5)
WBC: 3.8 10*3/uL — ABNORMAL LOW (ref 4.0–10.5)
nRBC: 0 % (ref 0.0–0.2)

## 2020-08-16 LAB — POCT PREGNANCY, URINE: Preg Test, Ur: NEGATIVE

## 2020-08-16 SURGERY — HYSTERECTOMY, TOTAL, LAPAROSCOPIC, WITH SALPINGECTOMY
Anesthesia: General

## 2020-08-16 MED ORDER — FENTANYL CITRATE (PF) 250 MCG/5ML IJ SOLN
INTRAMUSCULAR | Status: AC
Start: 2020-08-16 — End: ?
  Filled 2020-08-16: qty 5

## 2020-08-16 MED ORDER — MIDAZOLAM HCL 2 MG/2ML IJ SOLN
INTRAMUSCULAR | Status: DC | PRN
  Administered 2020-08-16: 2 mg via INTRAVENOUS

## 2020-08-16 MED ORDER — PHENYLEPHRINE HCL (PRESSORS) 10 MG/ML IV SOLN
INTRAVENOUS | Status: AC
  Filled 2020-08-16: qty 1

## 2020-08-16 MED ORDER — ROCURONIUM BROMIDE 10 MG/ML (PF) SYRINGE
PREFILLED_SYRINGE | INTRAVENOUS | Status: AC
  Filled 2020-08-16: qty 10

## 2020-08-16 MED ORDER — LACTATED RINGERS IV SOLN: INTRAVENOUS | Status: DC

## 2020-08-16 MED ORDER — LIDOCAINE HCL (PF) 2 % IJ SOLN
INTRAMUSCULAR | Status: AC
  Filled 2020-08-16: qty 5

## 2020-08-16 MED ORDER — DEXAMETHASONE SODIUM PHOSPHATE 10 MG/ML IJ SOLN
INTRAMUSCULAR | Status: AC
  Filled 2020-08-16: qty 1

## 2020-08-16 MED ORDER — LIDOCAINE-EPINEPHRINE 1 %-1:100000 IJ SOLN
INTRAMUSCULAR | Status: AC
  Filled 2020-08-16: qty 1

## 2020-08-16 MED ORDER — FAMOTIDINE 20 MG PO TABS: 20.0000 mg | ORAL_TABLET | Freq: Once | ORAL | Status: AC

## 2020-08-16 MED ORDER — GLYCOPYRROLATE 0.2 MG/ML IJ SOLN
INTRAMUSCULAR | Status: DC | PRN
  Administered 2020-08-16: .2 mg via INTRAVENOUS

## 2020-08-16 MED ORDER — BUPIVACAINE HCL (PF) 0.5 % IJ SOLN
INTRAMUSCULAR | Status: AC
  Filled 2020-08-16: qty 60

## 2020-08-16 MED ORDER — ONDANSETRON 4 MG PO TBDP: 4.0000 mg | ORAL_TABLET | Freq: Three times a day (TID) | ORAL | 0 refills | Status: DC | PRN

## 2020-08-16 MED ORDER — FAMOTIDINE 20 MG PO TABS
ORAL_TABLET | ORAL | Status: AC
  Administered 2020-08-16: 20 mg via ORAL
  Filled 2020-08-16: qty 1

## 2020-08-16 MED ORDER — OXYCODONE-ACETAMINOPHEN 5-325 MG PO TABS: 1.0000 | ORAL_TABLET | Freq: Four times a day (QID) | ORAL | 0 refills | Status: DC | PRN

## 2020-08-16 MED ORDER — KETOROLAC TROMETHAMINE 30 MG/ML IJ SOLN
INTRAMUSCULAR | Status: AC
  Filled 2020-08-16: qty 1

## 2020-08-16 MED ORDER — ROCURONIUM BROMIDE 100 MG/10ML IV SOLN
INTRAVENOUS | Status: DC | PRN
  Administered 2020-08-16: 50 mg via INTRAVENOUS

## 2020-08-16 MED ORDER — PHENYLEPHRINE HCL (PRESSORS) 10 MG/ML IV SOLN
INTRAVENOUS | Status: DC | PRN
  Administered 2020-08-16: 100 ug via INTRAVENOUS

## 2020-08-16 MED ORDER — FLUORESCEIN SODIUM 10 % IV SOLN
INTRAVENOUS | Status: AC
  Filled 2020-08-16: qty 5

## 2020-08-16 MED ORDER — OXYCODONE HCL 5 MG PO TABS: 5.0000 mg | ORAL_TABLET | Freq: Once | ORAL | Status: DC | PRN

## 2020-08-16 MED ORDER — DEXAMETHASONE SODIUM PHOSPHATE 10 MG/ML IJ SOLN
INTRAMUSCULAR | Status: DC | PRN
  Administered 2020-08-16: 10 mg via INTRAVENOUS

## 2020-08-16 MED ORDER — FENTANYL CITRATE (PF) 100 MCG/2ML IJ SOLN
INTRAMUSCULAR | Status: AC
  Filled 2020-08-16: qty 2

## 2020-08-16 MED ORDER — MIDAZOLAM HCL 2 MG/2ML IJ SOLN
INTRAMUSCULAR | Status: AC
  Filled 2020-08-16: qty 2

## 2020-08-16 MED ORDER — IBUPROFEN 800 MG PO TABS: 800.0000 mg | ORAL_TABLET | Freq: Three times a day (TID) | ORAL | 0 refills | Status: DC | PRN

## 2020-08-16 MED ORDER — ONDANSETRON HCL 4 MG/2ML IJ SOLN
INTRAMUSCULAR | Status: DC | PRN
  Administered 2020-08-16: 4 mg via INTRAVENOUS

## 2020-08-16 MED ORDER — PROPOFOL 500 MG/50ML IV EMUL
INTRAVENOUS | Status: AC
  Filled 2020-08-16: qty 50

## 2020-08-16 MED ORDER — PROPOFOL 500 MG/50ML IV EMUL
INTRAVENOUS | Status: DC | PRN
  Administered 2020-08-16: 75 ug/kg/min via INTRAVENOUS
  Administered 2020-08-16: 125 ug/kg/min via INTRAVENOUS

## 2020-08-16 MED ORDER — CLINDAMYCIN PHOSPHATE 900 MG/50ML IV SOLN
INTRAVENOUS | Status: AC
  Filled 2020-08-16: qty 50

## 2020-08-16 MED ORDER — OXYCODONE HCL 5 MG/5ML PO SOLN: 5.0000 mg | Freq: Once | ORAL | Status: DC | PRN

## 2020-08-16 MED ORDER — CHLORHEXIDINE GLUCONATE 0.12 % MT SOLN
OROMUCOSAL | Status: AC
  Administered 2020-08-16: 15 mL via OROMUCOSAL
  Filled 2020-08-16: qty 15

## 2020-08-16 MED ORDER — HEMOSTATIC AGENTS (NO CHARGE) OPTIME
TOPICAL | Status: DC | PRN
  Administered 2020-08-16: 1 via TOPICAL

## 2020-08-16 MED ORDER — PROPOFOL 10 MG/ML IV BOLUS
INTRAVENOUS | Status: DC | PRN
  Administered 2020-08-16: 100 mg via INTRAVENOUS

## 2020-08-16 MED ORDER — ACETAMINOPHEN 10 MG/ML IV SOLN
INTRAVENOUS | Status: AC
  Filled 2020-08-16: qty 100

## 2020-08-16 MED ORDER — FENTANYL CITRATE (PF) 100 MCG/2ML IJ SOLN: 25.0000 ug | INTRAMUSCULAR | Status: DC | PRN

## 2020-08-16 MED ORDER — ORAL CARE MOUTH RINSE: 15.0000 mL | Freq: Once | OROMUCOSAL | Status: AC

## 2020-08-16 MED ORDER — FENTANYL CITRATE (PF) 100 MCG/2ML IJ SOLN
INTRAMUSCULAR | Status: DC | PRN
Start: 2020-08-16 — End: 2020-08-16
  Administered 2020-08-16 (×2): 50 ug via INTRAVENOUS
  Administered 2020-08-16: 100 ug via INTRAVENOUS
  Administered 2020-08-16 (×3): 50 ug via INTRAVENOUS

## 2020-08-16 MED ORDER — ACETAMINOPHEN 10 MG/ML IV SOLN
INTRAVENOUS | Status: DC | PRN
  Administered 2020-08-16: 1000 mg via INTRAVENOUS

## 2020-08-16 MED ORDER — LIDOCAINE HCL (CARDIAC) PF 100 MG/5ML IV SOSY
PREFILLED_SYRINGE | INTRAVENOUS | Status: DC | PRN
  Administered 2020-08-16: 60 mg via INTRAVENOUS

## 2020-08-16 MED ORDER — ONDANSETRON HCL 4 MG/2ML IJ SOLN
INTRAMUSCULAR | Status: AC
  Filled 2020-08-16: qty 2

## 2020-08-16 MED ORDER — CHLORHEXIDINE GLUCONATE 0.12 % MT SOLN: 15.0000 mL | Freq: Once | OROMUCOSAL | Status: AC

## 2020-08-16 MED ORDER — ESTROGENS, CONJUGATED 0.625 MG/GM VA CREA
TOPICAL_CREAM | VAGINAL | Status: AC
  Filled 2020-08-16: qty 30

## 2020-08-16 MED ORDER — OXYCODONE-ACETAMINOPHEN 5-325 MG PO TABS
ORAL_TABLET | ORAL | Status: AC
  Filled 2020-08-16: qty 1

## 2020-08-16 MED ORDER — BUPIVACAINE HCL 0.5 % IJ SOLN
INTRAMUSCULAR | Status: DC | PRN
  Administered 2020-08-16: 13 mL

## 2020-08-16 MED ORDER — FLUORESCEIN SODIUM 10 % IV SOLN
INTRAVENOUS | Status: DC | PRN
  Administered 2020-08-16: 50 mg via INTRAVENOUS

## 2020-08-16 MED ORDER — OXYCODONE-ACETAMINOPHEN 5-325 MG PO TABS: 1.0000 | ORAL_TABLET | Freq: Once | ORAL | Status: DC

## 2020-08-16 MED ORDER — SUGAMMADEX SODIUM 500 MG/5ML IV SOLN
INTRAVENOUS | Status: DC | PRN
  Administered 2020-08-16: 200 mg via INTRAVENOUS

## 2020-08-16 MED ORDER — KETOROLAC TROMETHAMINE 30 MG/ML IJ SOLN
INTRAMUSCULAR | Status: DC | PRN
  Administered 2020-08-16: 30 mg via INTRAVENOUS

## 2020-08-16 SURGICAL SUPPLY — 64 items
ADH SKN CLS APL DERMABOND .7 (GAUZE/BANDAGES/DRESSINGS) ×2
APL PRP STRL LF DISP 70% ISPRP (MISCELLANEOUS) ×2
APL SRG 38 LTWT LNG FL B (MISCELLANEOUS) ×2
APPLICATOR ARISTA FLEXITIP XL (MISCELLANEOUS) ×3 IMPLANT
BAG DRN RND TRDRP ANRFLXCHMBR (UROLOGICAL SUPPLIES) ×2
BAG URINE DRAIN 2000ML AR STRL (UROLOGICAL SUPPLIES) ×3 IMPLANT
BLADE SURG SZ11 CARB STEEL (BLADE) ×3 IMPLANT
CATH FOLEY 2WAY  5CC 16FR (CATHETERS) ×1
CATH FOLEY 2WAY 5CC 16FR (CATHETERS) ×2
CATH URTH 16FR FL 2W BLN LF (CATHETERS) ×2 IMPLANT
CHLORAPREP W/TINT 26 (MISCELLANEOUS) ×3 IMPLANT
COVER LIGHT HANDLE STERIS (MISCELLANEOUS) ×6 IMPLANT
COVER WAND RF STERILE (DRAPES) ×3 IMPLANT
DEFOGGER SCOPE WARMER CLEARIFY (MISCELLANEOUS) ×3 IMPLANT
DERMABOND ADVANCED (GAUZE/BANDAGES/DRESSINGS) ×1
DERMABOND ADVANCED .7 DNX12 (GAUZE/BANDAGES/DRESSINGS) ×2 IMPLANT
DEVICE SUTURE ENDOST 10MM (ENDOMECHANICALS) ×3 IMPLANT
DRAPE 3/4 80X56 (DRAPES) ×3 IMPLANT
DRAPE CAMERA CLOSED 9X96 (DRAPES) ×3 IMPLANT
DRAPE LEGGINS SURG 28X43 STRL (DRAPES) ×3 IMPLANT
DRAPE UNDER BUTTOCK W/FLU (DRAPES) ×3 IMPLANT
GAUZE 4X4 16PLY RFD (DISPOSABLE) ×3 IMPLANT
GLOVE BIO SURGEON STRL SZ7 (GLOVE) ×21 IMPLANT
GLOVE BIOGEL PI IND STRL 7.5 (GLOVE) ×2 IMPLANT
GLOVE BIOGEL PI INDICATOR 7.5 (GLOVE) ×1
GLOVE INDICATOR 7.5 STRL GRN (GLOVE) ×9 IMPLANT
GOWN STRL REUS W/ TWL LRG LVL3 (GOWN DISPOSABLE) ×14 IMPLANT
GOWN STRL REUS W/ TWL XL LVL3 (GOWN DISPOSABLE) ×2 IMPLANT
GOWN STRL REUS W/TWL LRG LVL3 (GOWN DISPOSABLE) ×21
GOWN STRL REUS W/TWL XL LVL3 (GOWN DISPOSABLE) ×3
GRASPER SUT TROCAR 14GX15 (MISCELLANEOUS) ×3 IMPLANT
HEMOSTAT ARISTA ABSORB 3G PWDR (HEMOSTASIS) ×3 IMPLANT
IRRIGATION STRYKERFLOW (MISCELLANEOUS) ×2 IMPLANT
IRRIGATOR STRYKERFLOW (MISCELLANEOUS) ×3
IV LACTATED RINGERS 1000ML (IV SOLUTION) ×6 IMPLANT
KIT PINK PAD W/HEAD ARE REST (MISCELLANEOUS) ×3
KIT PINK PAD W/HEAD ARM REST (MISCELLANEOUS) ×2 IMPLANT
KIT TURNOVER CYSTO (KITS) ×3 IMPLANT
LABEL OR SOLS (LABEL) ×3 IMPLANT
LIGASURE VESSEL 5MM BLUNT TIP (ELECTROSURGICAL) ×3 IMPLANT
MANIPULATOR VCARE LG CRV RETR (MISCELLANEOUS) IMPLANT
MANIPULATOR VCARE SML CRV RETR (MISCELLANEOUS) ×3 IMPLANT
MANIPULATOR VCARE STD CRV RETR (MISCELLANEOUS) IMPLANT
NEEDLE HYPO 22GX1.5 SAFETY (NEEDLE) ×3 IMPLANT
OCCLUDER COLPOPNEUMO (BALLOONS) ×3 IMPLANT
PACK LAP CHOLECYSTECTOMY (MISCELLANEOUS) ×3 IMPLANT
PAD OB MATERNITY 4.3X12.25 (PERSONAL CARE ITEMS) ×3 IMPLANT
PAD PREP 24X41 OB/GYN DISP (PERSONAL CARE ITEMS) ×3 IMPLANT
SCISSORS METZENBAUM CVD 33 (INSTRUMENTS) ×3 IMPLANT
SET CYSTO W/LG BORE CLAMP LF (SET/KITS/TRAYS/PACK) ×3 IMPLANT
SET TRI-LUMEN FLTR TB AIRSEAL (TUBING) ×3 IMPLANT
SLEEVE ENDOPATH XCEL 5M (ENDOMECHANICALS) ×6 IMPLANT
SOL PREP PVP 2OZ (MISCELLANEOUS) ×3
SOLUTION PREP PVP 2OZ (MISCELLANEOUS) ×2 IMPLANT
SURGILUBE 2OZ TUBE FLIPTOP (MISCELLANEOUS) ×3 IMPLANT
SUT ENDO VLOC 180-0-8IN (SUTURE) ×3 IMPLANT
SUT MNCRL 4-0 (SUTURE) ×3
SUT MNCRL 4-0 27XMFL (SUTURE) ×2
SUT VIC AB 0 CT1 36 (SUTURE) ×3 IMPLANT
SUTURE MNCRL 4-0 27XMF (SUTURE) ×2 IMPLANT
SYR 10ML LL (SYRINGE) ×6 IMPLANT
SYR 50ML LL SCALE MARK (SYRINGE) ×3 IMPLANT
TROCAR PORT AIRSEAL 8X100 (TROCAR) ×3 IMPLANT
TROCAR XCEL NON-BLD 5MMX100MML (ENDOMECHANICALS) ×3 IMPLANT

## 2020-08-16 NOTE — Anesthesia Preprocedure Evaluation (Signed)
Anesthesia Evaluation  Patient identified by MRN, date of birth, ID band Patient awake    Reviewed: Allergy & Precautions, H&P , NPO status , Patient's Chart, lab work & pertinent test results  History of Anesthesia Complications Negative for: history of anesthetic complications  Airway Mallampati: III  TM Distance: >3 FB Neck ROM: full    Dental  (+) Chipped   Pulmonary neg shortness of breath, asthma ,    Pulmonary exam normal        Cardiovascular Exercise Tolerance: Good (-) angina(-) Past MI negative cardio ROS Normal cardiovascular exam     Neuro/Psych negative neurological ROS  negative psych ROS   GI/Hepatic negative GI ROS, Neg liver ROS, neg GERD  ,  Endo/Other  negative endocrine ROS  Renal/GU      Musculoskeletal   Abdominal   Peds  Hematology negative hematology ROS (+)   Anesthesia Other Findings Past Medical History: No date: Asthma     Comment:  as a child No date: Scoliosis  Past Surgical History: No date: BACK SURGERY No date: WRIST SURGERY  BMI    Body Mass Index: 20.60 kg/m      Reproductive/Obstetrics negative OB ROS                             Anesthesia Physical Anesthesia Plan  ASA: III  Anesthesia Plan: General ETT   Post-op Pain Management:    Induction: Intravenous  PONV Risk Score and Plan: Ondansetron, Dexamethasone, Midazolam and Treatment may vary due to age or medical condition  Airway Management Planned: Oral ETT  Additional Equipment:   Intra-op Plan:   Post-operative Plan: Extubation in OR  Informed Consent: I have reviewed the patients History and Physical, chart, labs and discussed the procedure including the risks, benefits and alternatives for the proposed anesthesia with the patient or authorized representative who has indicated his/her understanding and acceptance.     Dental Advisory Given  Plan Discussed with:  Anesthesiologist, CRNA and Surgeon  Anesthesia Plan Comments: (Patient consented for risks of anesthesia including but not limited to:  - adverse reactions to medications - damage to eyes, teeth, lips or other oral mucosa - nerve damage due to positioning  - sore throat or hoarseness - Damage to heart, brain, nerves, lungs, other parts of body or loss of life  Patient voiced understanding.)        Anesthesia Quick Evaluation

## 2020-08-16 NOTE — Interval H&P Note (Signed)
History and Physical Interval Note:  08/16/2020 9:47 AM  Jessica Everett  has presented today for surgery, with the diagnosis of Menorrhagia with irreguler cycle N92.1.  The various methods of treatment have been discussed with the patient and family. After consideration of risks, benefits and other options for treatment, the patient has consented to  Procedure(s): TOTAL LAPAROSCOPIC HYSTERECTOMY WITH BILATERIAL SALPINGECTOMY (Bilateral) CYSTOSCOPY (N/A) as a surgical intervention.  The patient's history has been reviewed, patient examined, no change in status, stable for surgery.  I have reviewed the patient's chart and labs.  Questions were answered to the patient's satisfaction.    Thomasene Mohair, MD, [facility], [facility] 08/16/2020 9:47 AM

## 2020-08-16 NOTE — Anesthesia Procedure Notes (Cosign Needed)
Procedure Name: Intubation Date/Time: 08/16/2020 10:01 AM Performed by: Andria Frames, MD Pre-anesthesia Checklist: Patient identified, Emergency Drugs available, Suction available and Patient being monitored Patient Re-evaluated:Patient Re-evaluated prior to induction Oxygen Delivery Method: Circle system utilized Preoxygenation: Pre-oxygenation with 100% oxygen Induction Type: IV induction Ventilation: Mask ventilation without difficulty Laryngoscope Size: Mac, McGraph and 3 Grade View: Grade I Tube type: Oral Tube size: 6.5 mm Number of attempts: 1 Airway Equipment and Method: Stylet Placement Confirmation: ETT inserted through vocal cords under direct vision,  positive ETCO2 and breath sounds checked- equal and bilateral Secured at: 20 cm Tube secured with: Tape Dental Injury: Teeth and Oropharynx as per pre-operative assessment

## 2020-08-16 NOTE — Discharge Instructions (Signed)

## 2020-08-16 NOTE — Op Note (Signed)
Operative Note    Name: Jessica Everett  Date of Service: 08/16/2020  DOB: 07/29/1996  MRN: [id number]   Pre-Operative Diagnosis:  menorrhagia with irregular cycle [N92.1]  Post-Operative Diagnosis: menorrhagia with irregular cycle [N92.1]  Procedures:  1. Total Laparoscopic Hysterectomy, bilateral salpingectomy 2. Cystoscopy  Primary Surgeon: Thomasene Mohair, MD   Assistant Surgeon: Annamarie Major, MD - No other capable assistant available, in surgery requiring high level assistant.  EBL: 50 mL   IVF: 600 mL   Urine output: 200 mL  Specimens: Uterus with cervix and bilateral fallopian tubes  Drains: none  Complications: None   Disposition: PACU   Condition: Stable   Findings: Normal appearing uterus, cervix, and bilateral fallopian tubes and ovaries  Procedure Summary:  The patient was taken to the operating room where general anesthesia was administered and found to be adequate. She was placed in the dorsal supine lithotomy position in [place] stirrups and prepped and draped in usual sterile fashion. After a timeout was called, an indwelling catheter was placed in her bladder. A sterile speculum was placed in the vagina and a single-tooth tenaculum was used to grasp the anterior lip of the cervix. A V-Care uterine manipulator was affixed to the uterus in accordance to the manufacturers recommendations. The speculum and tenaculum was removed from the vagina.  Attention was turned to the abdomen where, after injection of local anesthetic, a 5 mm infraumbilical incision was made with the scalpel. Entry into the abdomen was obtained via Optiview trocar technique (a blunt entry technique with camera visualization through the obturator upon entry). Verification of entry into the abdomen was obtained using opening pressures. The abdomen was insufflated with CO2. The camera was introduced through the trocar with verification of atraumatic entry. Left and right lower quadrant 5 mm ports were  created via direct intra-abdominal camera visualization without difficulty. An 8 mm AirSeal suprapubic port was placed in a similar fashion without difficulty.  After inspection of the abdomen and pelvis with the above-noted findings, the bilateral ureters were identified and found to be well away from the operative area of interest. The left fallopian tube was grasped at the fimbriated end and was transected using the LigaSure along the mesosalpinx in a lateral to medial fashion. The LigaSure then was used to transect the left round ligament and the utero-ovarian ligament was transected. Tissue was divided along the left broad ligament to the level of the interior cervical os. Adhesions from the anterior uterus were taken down with great care and the lower uterine segment was identified. Bladder tissue was dissected off the lower uterine segment and cervix without difficulty. The left uterine artery was skeletonized and identified and after ligation was transected with the LigaSure device. The same procedure was carried out on the right side. The colpotomy was performed using monopolar electrocautery in a circumferential fashion following the KOH ring.  The uterus and fallopian tubes and cervix were removed through the vagina.  Closure of the vaginal cuff was undertaken using the V-lock stitch in a running fashion. All vascular pedicles were inspected and found to be hemostatic. Copious irrigation was undertaken and hemostasis was again verified. Pressure was dropped in the abdomen to 5 mmHg to verify ongoing hemostasis.  Arista was utilized to ensure ongoing hemostasis.   Cystoscopy was undertaken at this point. The Foley catheter was removed and the 70 cystoscope was gently introduced through the urethra. The bladder survey was undertaken with efflux of urine from both orifices noted. There was a slight  delay in efflux of urine from the left ureteral orifice.  Fluorescein dye was injected IV and within  five minutes efflux from both ureters was noted multiple times.  There were no defects noted in the bladder wall. The cystoscope was removed and the Foley catheter was replaced.  The suprapubic trocar was removed and the fascia was reapproximated using #0 Vicryl with a single stitch. The abdomen was then desufflated of CO2 after removal of all instruments. The suprapubic skin incision was closed using 4-0 Vicryl in a subcuticular fashion. The remaining skin incisions were closed using a subcutaneous 4-0 monocryl stitch to reduce tension on the skin closure. Surgical skin glue and was placed over all incision sites.   The catheter was removed after the bladder was drained.  A digital sweep of the vagina revealed good closure of the vaginal cuff and there were no remaining instruments or sponges.   The surgical assistant performed creation of a trocar site, retracted tissue, performed ligation and transection of tissue along the right adnexa and performed part of the colpotomy.  The assistant also assisted with closure of the vaginal cuff with retraction of the suture.  He also assisted in the closure of the suprapubic port.    The patient tolerated the procedure well.  Sponge, lap, needle, and instrument counts were correct x 2.  VTE prophylaxis: SCDs. Antibiotic prophylaxis: Clindamycin and gentamicin. She was awakened in the operating room and was taken to the PACU in stable condition. The assistant surgeon was an MD due to lack of availability of another Sales promotion account executive.   Thomasene Mohair, MD 08/16/2020 12:12 PM

## 2020-08-16 NOTE — Anesthesia Postprocedure Evaluation (Signed)
Anesthesia Post Note  Patient: Jessica Everett  Procedure(s) Performed: TOTAL LAPAROSCOPIC HYSTERECTOMY WITH BILATERIAL SALPINGECTOMY (Bilateral ) CYSTOSCOPY (N/A )  Patient location during evaluation: PACU Anesthesia Type: General Level of consciousness: awake and alert Pain management: pain level controlled Vital Signs Assessment: post-procedure vital signs reviewed and stable Respiratory status: spontaneous breathing, nonlabored ventilation and respiratory function stable Cardiovascular status: blood pressure returned to baseline and stable Postop Assessment: no apparent nausea or vomiting Anesthetic complications: no   No complications documented.   Last Vitals:  Vitals:   08/16/20 1321 08/16/20 1335  BP: 106/66 110/70  Pulse: 67 (!) 58  Resp: 13 14  Temp:  (!) 36.2 C  SpO2: 100% 100%    Last Pain:  Vitals:   08/16/20 1335  TempSrc: Temporal  PainSc: 9                  Aurelio Brash Marilynne Dupuis

## 2020-08-16 NOTE — Transfer of Care (Cosign Needed)
Immediate Anesthesia Transfer of Care Note  Patient: Jessica Everett  Procedure(s) Performed: TOTAL LAPAROSCOPIC HYSTERECTOMY WITH BILATERIAL SALPINGECTOMY (Bilateral ) CYSTOSCOPY (N/A )  Patient Location: PACU  Anesthesia Type:General  Level of Consciousness: drowsy  Airway & Oxygen Therapy: Patient Spontanous Breathing and Patient connected to face mask  Post-op Assessment: Report given to RN  Post vital signs: Reviewed and stable  Last Vitals:  Vitals Value Taken Time  BP 112/47 08/16/20 1221  Temp    Pulse 76 08/16/20 1228  Resp 15 08/16/20 1228  SpO2 100 % 08/16/20 1228  Vitals shown include unvalidated device data.  Last Pain:  Vitals:   08/16/20 0820  TempSrc: Oral  PainSc: 0-No pain         Complications: No complications documented.

## 2020-08-17 ENCOUNTER — Encounter: Payer: Self-pay | Admitting: Obstetrics and Gynecology

## 2020-08-17 ENCOUNTER — Telehealth: Payer: Self-pay | Admitting: Obstetrics and Gynecology

## 2020-08-17 LAB — SURGICAL PATHOLOGY

## 2020-08-17 NOTE — Telephone Encounter (Signed)
Left generic VM 

## 2020-08-22 ENCOUNTER — Other Ambulatory Visit: Payer: Self-pay

## 2020-08-22 ENCOUNTER — Ambulatory Visit: Payer: BC Managed Care – PPO | Admitting: Obstetrics and Gynecology

## 2020-08-22 ENCOUNTER — Encounter: Payer: Self-pay | Admitting: Obstetrics and Gynecology

## 2020-08-22 VITALS — BP 112/73 | HR 76 | Wt 122.0 lb

## 2020-08-22 DIAGNOSIS — Z09 Encounter for follow-up examination after completed treatment for conditions other than malignant neoplasm: Secondary | ICD-10-CM

## 2020-08-22 DIAGNOSIS — G8918 Other acute postprocedural pain: Secondary | ICD-10-CM

## 2020-08-22 MED ORDER — HYDROCODONE-ACETAMINOPHEN 5-325 MG PO TABS: 1.0000 | ORAL_TABLET | Freq: Three times a day (TID) | ORAL | 0 refills | Status: DC | PRN

## 2020-08-22 MED ORDER — IBUPROFEN 600 MG PO TABS: 600.0000 mg | ORAL_TABLET | Freq: Four times a day (QID) | ORAL | 0 refills | Status: DC | PRN

## 2020-08-22 NOTE — Progress Notes (Signed)
   Postoperative Follow-up Patient presents post op from TLH/BS/cystoscopy 6 days ago for menorrhagia with irregular cycle.  Subjective: Patient reports marked improvement in her preop symptoms. Eating a regular diet without difficulty. She continues to have pain, though improving.  She still requires percocet and ibuprofen.   Activity: increasing slowly. Denies fevers, chills, nausea, vomiting, vaginal bleeding. She does have constipation for which she takes docusate and miralax.   Objective: Vital Signs: BP 112/73   Pulse 76   Wt 122 lb (55.3 kg)   LMP 08/06/2020   BMI 20.94 kg/m  Constitutional: Well nourished, well developed female in no acute distress.  HEENT: normal Skin: Warm and dry.  Extremity: no edema  Abdomen: Soft, non-tender, normal bowel sounds; no bruits, organomegaly or masses. clean, dry and intact  Assessment: 24 y.o. s/p TLH/BS/Cysto progressing well  Plan: Patient has done well after surgery with no apparent complications.  I have discussed the post-operative course to date, and the expected progress moving forward.  The patient understands what complications to be concerned about.  I will see the patient in routine follow up, or sooner if needed.    Activity plan: increase slowly. No heavy lifting x 6 weeks total (> 25 pounds) Rx for a little more pain medication. Encourage her to continue bowel regimen.   Thomasene Mohair, MD 08/22/2020, 12:07 PM

## 2020-09-05 ENCOUNTER — Ambulatory Visit: Payer: BC Managed Care – PPO | Admitting: Obstetrics and Gynecology

## 2020-09-19 NOTE — Telephone Encounter (Signed)
Patient has appointment for 09/27/20 with you in [place]. Please advise for pain.

## 2020-09-26 ENCOUNTER — Other Ambulatory Visit: Payer: Self-pay

## 2020-09-26 ENCOUNTER — Encounter: Payer: Self-pay | Admitting: Obstetrics and Gynecology

## 2020-09-26 ENCOUNTER — Ambulatory Visit (INDEPENDENT_AMBULATORY_CARE_PROVIDER_SITE_OTHER): Payer: BC Managed Care – PPO | Admitting: Obstetrics and Gynecology

## 2020-09-26 VITALS — BP 122/74 | Ht 64.0 in | Wt 124.0 lb

## 2020-09-26 DIAGNOSIS — Z09 Encounter for follow-up examination after completed treatment for conditions other than malignant neoplasm: Secondary | ICD-10-CM

## 2020-09-26 NOTE — Progress Notes (Signed)
   Postoperative Follow-up Patient presents post op from Total Laparoscopic Hysterectomy, bilateral salpingectomy, cystoscopy 6 weeks ago for menorrhagia with irregular cycle.  Subjective: Patient reports marked improvement in her preop symptoms. Eating a regular diet without difficulty. Some pain on sides lateral to umbilicus.  No causative factors. Not all the time.  Activity: normal activities of daily living. Denies fevers, chills, nausea, vomiting, problems with voiding, bowel movements.  Denies vaginal bleeding.  Objective: Vitals:   09/26/20 1413  BP: 122/74   Vital Signs: BP 122/74   Ht 5\' 4"  (1.626 m)   Wt 124 lb (56.2 kg)   LMP 08/06/2020   BMI 21.28 kg/m  Constitutional: Well nourished, well developed female in no acute distress.  HEENT: normal Skin: Warm and dry.  Extremity: no edema  Abdomen: Soft, non-tender, normal bowel sounds; no bruits, organomegaly or masses.  No rebound or guarding.  I can not elicit the pain symptoms.  clean, dry and intact  Pelvic exam: (female chaperone present) is not limited by body habitus EGBUS: within normal limits Vagina: within normal limits and without blood in vault.  Vaginal cuff is clean, dry, intact, without erythema, induration ,warmth, and tenderness    Assessment: 24 y.o. s/p above surgery progressing well  Plan: Patient has done well after surgery with no apparent complications.  I have discussed the post-operative course to date, and the expected progress moving forward.  The patient understands what complications to be concerned about.  I will see the patient in routine follow up, or sooner if needed.    Activity plan: increase slowly.  No sex for 8-12 weeks post-op  Follow up 1 year for annual.   30, MD 09/26/2020, 2:20 PM

## 2020-09-27 ENCOUNTER — Ambulatory Visit: Payer: BC Managed Care – PPO | Admitting: Obstetrics and Gynecology

## 2021-05-29 ENCOUNTER — Telehealth: Payer: Self-pay | Admitting: Obstetrics and Gynecology

## 2021-05-29 NOTE — Telephone Encounter (Signed)
Called patient to reschedule the 8/15 appt with SDJ.  Left a message on voicemail.

## 2021-07-05 NOTE — Telephone Encounter (Signed)
Close note

## 2021-07-29 ENCOUNTER — Ambulatory Visit: Payer: BC Managed Care – PPO | Admitting: Obstetrics and Gynecology

## 2021-08-05 ENCOUNTER — Other Ambulatory Visit (HOSPITAL_COMMUNITY)
Admission: RE | Admit: 2021-08-05 | Discharge: 2021-08-05 | Disposition: A | Discharge status: Home / Self Care | Payer: BC Managed Care – PPO | Source: Ambulatory Visit | Attending: Obstetrics and Gynecology | Admitting: Obstetrics and Gynecology

## 2021-08-05 ENCOUNTER — Ambulatory Visit (INDEPENDENT_AMBULATORY_CARE_PROVIDER_SITE_OTHER): Payer: BC Managed Care – PPO | Admitting: Obstetrics and Gynecology

## 2021-08-05 ENCOUNTER — Encounter: Payer: Self-pay | Admitting: Obstetrics and Gynecology

## 2021-08-05 ENCOUNTER — Other Ambulatory Visit: Payer: Self-pay

## 2021-08-05 VITALS — BP 90/58 | HR 57 | Ht 64.0 in | Wt 112.0 lb

## 2021-08-05 DIAGNOSIS — Z113 Encounter for screening for infections with a predominantly sexual mode of transmission: Secondary | ICD-10-CM | POA: Insufficient documentation

## 2021-08-05 DIAGNOSIS — R001 Bradycardia, unspecified: Secondary | ICD-10-CM

## 2021-08-05 DIAGNOSIS — Z01419 Encounter for gynecological examination (general) (routine) without abnormal findings: Secondary | ICD-10-CM

## 2021-08-05 DIAGNOSIS — Z1339 Encounter for screening examination for other mental health and behavioral disorders: Secondary | ICD-10-CM | POA: Diagnosis not present

## 2021-08-05 DIAGNOSIS — Z1331 Encounter for screening for depression: Secondary | ICD-10-CM

## 2021-08-05 NOTE — Progress Notes (Signed)
Gynecology Annual Exam  PCP: Kandyce Rud, MD  Chief Complaint  Patient presents with   Annual Exam    Recent issue with low pulse 2x/week. Unable to get in w/PCP.   History of Present Illness:  Ms. Jessica Everett is a 25 y.o. G0P0000 who LMP was Patient's last menstrual period was 08/06/2020., presents today for her annual examination.  Her menses are absent (s/p hysterectomy).  She is sexually active, no problems with intercourse.   Last Pap: 1 year  Results were: no abnormalities /neg HPV DNA not done Hx of STDs: chlamydia  There is no FH of breast cancer. There is no FH of ovarian cancer. The patient does not do self-breast exams.  Tobacco use: The patient denies current or previous tobacco use. Alcohol use: social drinker Exercise: riding horses  She has been getting warning on her Apple watch that her heart rate is low. She states her watch told her her pulse rate was in the 30s.  She has not verified that with checking her own pulse.   The patient wears seatbelts: yes.   The patient reports that domestic violence in her life is absent.   Past Medical History:  Diagnosis Date   Asthma    as a child   Scoliosis     Past Surgical History:  Procedure Laterality Date   BACK SURGERY     CYSTOSCOPY N/A 08/16/2020   Procedure: CYSTOSCOPY;  Surgeon: Conard Novak, MD;  Location: [facility];  Service: Gynecology;  Laterality: N/A;   TOTAL LAPAROSCOPIC HYSTERECTOMY WITH SALPINGECTOMY Bilateral 08/16/2020   Procedure: TOTAL LAPAROSCOPIC HYSTERECTOMY WITH BILATERIAL SALPINGECTOMY;  Surgeon: Conard Novak, MD;  Location: [facility];  Service: Gynecology;  Laterality: Bilateral;   WRIST SURGERY      Prior to Admission medications   Medication Sig Start Date End Date Taking? Authorizing Provider  mupirocin ointment (BACTROBAN) 2 % Apply topically 3 (three) times daily. 05/22/21  Yes [provider]    Allergies  Allergen Reactions   Penicillins Swelling   Tessalon  [Benzonatate] Other (See Comments)    Tingling and numbness in legs   Obstetric History: G0P0000  Social History   Socioeconomic History   Marital status: Single    Spouse name: Not on file   Number of children: Not on file   Years of education: Not on file   Highest education level: Not on file  Occupational History   Not on file  Tobacco Use   Smoking status: Never   Smokeless tobacco: Never  Vaping Use   Vaping Use: Never used  Substance and Sexual Activity   Alcohol use: Yes   Drug use: No   Sexual activity: Yes    Birth control/protection: Surgical    Comment: Hysterectomy  Other Topics Concern   Not on file  Social History Narrative   Not on file   Social Determinants of Health   Financial Resource Strain: Not on file  Food Insecurity: Not on file  Transportation Needs: Not on file  Physical Activity: Not on file  Stress: Not on file  Social Connections: Not on file  Intimate Partner Violence: Not on file    Family History  Problem Relation Age of Onset   Cancer Maternal Grandfather    Heart disease Maternal Grandfather    Stroke Maternal Grandfather     Review of Systems  Constitutional: Negative.   HENT: Negative.    Eyes: Negative.   Respiratory: Negative.    Cardiovascular: Negative.  Gastrointestinal: Negative.   Genitourinary: Negative.   Musculoskeletal: Negative.   Skin: Negative.   Neurological: Negative.   Psychiatric/Behavioral: Negative.      Physical Exam BP (!) 90/58 (Cuff Size: Normal)   Pulse (!) 57   Ht 5\' 4"  (1.626 m)   Wt 112 lb (50.8 kg)   LMP 08/06/2020   BMI 19.22 kg/m    Physical Exam Constitutional:      General: She is not in acute distress.    Appearance: Normal appearance. She is well-developed.  Genitourinary:     Vulva, bladder and urethral meatus normal.     Right Labia: No rash, tenderness, lesions, skin changes or Bartholin's cyst.    Left Labia: No tenderness, lesions, skin changes, Bartholin's cyst  or rash.    No inguinal adenopathy present in the right or left side.    Pelvic Tanner Score: 5/5.    Vaginal cuff intact.    No vaginal discharge, erythema, tenderness or bleeding.      Right Adnexa: not tender, not full and no mass present.    Left Adnexa: not tender, not full and no mass present.    Cervix is absent.     Uterus is absent.     Pelvic exam was performed with patient in the lithotomy position.  Breasts:    Right: No inverted nipple, mass, nipple discharge, skin change or tenderness.     Left: No inverted nipple, mass, nipple discharge, skin change or tenderness.  HENT:     Head: Normocephalic and atraumatic.  Eyes:     General: No scleral icterus.    Conjunctiva/sclera: Conjunctivae normal.  Neck:     Thyroid: No thyromegaly.  Cardiovascular:     Rate and Rhythm: Normal rate and regular rhythm.     Heart sounds: No murmur heard.   No friction rub. No gallop.  Pulmonary:     Effort: Pulmonary effort is normal. No respiratory distress.     Breath sounds: Normal breath sounds. No wheezing or rales.  Abdominal:     General: Bowel sounds are normal. There is no distension.     Palpations: Abdomen is soft. There is no mass.     Tenderness: There is no abdominal tenderness. There is no guarding or rebound.     Hernia: There is no hernia in the left inguinal area or right inguinal area.  Musculoskeletal:        General: No swelling or tenderness. Normal range of motion.     Cervical back: Normal range of motion and neck supple.  Lymphadenopathy:     Cervical: No cervical adenopathy.     Lower Body: No right inguinal adenopathy. No left inguinal adenopathy.  Neurological:     General: No focal deficit present.     Mental Status: She is alert and oriented to person, place, and time.     Cranial Nerves: No cranial nerve deficit.  Skin:    General: Skin is warm and dry.     Findings: No erythema or rash.  Psychiatric:        Mood and Affect: Mood normal.         Behavior: Behavior normal.        Judgment: Judgment normal.    Female chaperone present for pelvic and breast  portions of the physical exam  Results: AUDIT Questionnaire (screen for alcoholism): 3 PHQ-9: 1   Assessment: 25 y.o. G0P0000 female here for routine annual gynecologic examination  Plan: Problem List Items Addressed This  Visit   None Visit Diagnoses     Women's annual routine gynecological examination    -  Primary   Relevant Orders   Cervicovaginal ancillary only   Screening for alcoholism       Screening for depression       Screen for STD (sexually transmitted disease)       Relevant Orders   Cervicovaginal ancillary only   Bradycardia       Relevant Orders   EKG 12-Lead       Screening: -- Blood pressure screen normal -- Weight screening: normal -- Depression screening negative (PHQ-9) -- Nutrition: normal -- cholesterol screening: not due for screening -- osteoporosis screening: not due -- tobacco screening: not using -- alcohol screening: AUDIT questionnaire indicates low-risk usage. -- family history of breast cancer screening: done. not at high risk. -- no evidence of domestic violence or intimate partner violence. -- STD screening: gonorrhea/chlamydia NAAT collected -- pap smear not collected per ASCCP guidelines -- HPV vaccination series: received  Will get EKG for slow heart rate.  Normal today, around 60 when I counted myself.   Thomasene Mohair, MD 08/05/2021 10:06 AM

## 2021-08-07 LAB — CERVICOVAGINAL ANCILLARY ONLY
Chlamydia: NEGATIVE
Comment: NEGATIVE
Comment: NEGATIVE
Comment: NORMAL
Neisseria Gonorrhea: NEGATIVE
Trichomonas: NEGATIVE

## 2021-10-14 ENCOUNTER — Other Ambulatory Visit: Payer: Self-pay

## 2021-10-14 ENCOUNTER — Encounter: Payer: Self-pay | Admitting: Obstetrics and Gynecology

## 2021-10-14 ENCOUNTER — Other Ambulatory Visit (HOSPITAL_COMMUNITY)
Admission: RE | Admit: 2021-10-14 | Discharge: 2021-10-14 | Disposition: A | Discharge status: Home / Self Care | Payer: BC Managed Care – PPO | Source: Ambulatory Visit | Attending: Obstetrics and Gynecology | Admitting: Obstetrics and Gynecology

## 2021-10-14 ENCOUNTER — Ambulatory Visit: Payer: BC Managed Care – PPO | Admitting: Obstetrics and Gynecology

## 2021-10-14 VITALS — BP 118/74 | Ht 64.0 in | Wt 118.0 lb

## 2021-10-14 DIAGNOSIS — Z113 Encounter for screening for infections with a predominantly sexual mode of transmission: Secondary | ICD-10-CM
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
## 2021-10-16 LAB — CERVICOVAGINAL ANCILLARY ONLY
Chlamydia: NEGATIVE
Comment: NEGATIVE
Comment: NEGATIVE
Comment: NORMAL
Neisseria Gonorrhea: NEGATIVE
Trichomonas: NEGATIVE
